# Patient Record
Sex: Female | Born: 1985 | Race: Black or African American | Hispanic: No | Smoking: Current every day smoker
Health system: Southern US, Community
[De-identification: ages and names within clinical notes are randomized; demographics above are authoritative.]

## PROBLEM LIST (undated history)

## (undated) DIAGNOSIS — E079 Disorder of thyroid, unspecified: Secondary | ICD-10-CM

## (undated) DIAGNOSIS — F329 Major depressive disorder, single episode, unspecified: Secondary | ICD-10-CM

## (undated) DIAGNOSIS — J45909 Unspecified asthma, uncomplicated: Secondary | ICD-10-CM

## (undated) DIAGNOSIS — F32A Depression, unspecified: Secondary | ICD-10-CM

## (undated) DIAGNOSIS — G43909 Migraine, unspecified, not intractable, without status migrainosus: Secondary | ICD-10-CM

## (undated) DIAGNOSIS — I10 Essential (primary) hypertension: Secondary | ICD-10-CM

---

## 2009-08-04 ENCOUNTER — Emergency Department (HOSPITAL_COMMUNITY): Admission: EM | Admit: 2009-08-04 | Discharge: 2009-08-04 | Payer: Self-pay | Admitting: Emergency Medicine

## 2009-08-26 ENCOUNTER — Emergency Department (HOSPITAL_COMMUNITY): Admission: EM | Admit: 2009-08-26 | Discharge: 2009-08-27 | Payer: Self-pay | Admitting: Emergency Medicine

## 2010-01-01 ENCOUNTER — Emergency Department (HOSPITAL_COMMUNITY): Admission: EM | Admit: 2010-01-01 | Discharge: 2010-01-01 | Payer: Self-pay | Admitting: Emergency Medicine

## 2010-07-03 ENCOUNTER — Emergency Department (HOSPITAL_BASED_OUTPATIENT_CLINIC_OR_DEPARTMENT_OTHER)
Admission: EM | Admit: 2010-07-03 | Discharge: 2010-07-03 | Payer: Self-pay | Source: Home / Self Care | Admitting: Emergency Medicine

## 2010-08-09 ENCOUNTER — Ambulatory Visit
Admission: RE | Admit: 2010-08-09 | Discharge: 2010-08-09 | Payer: Self-pay | Source: Home / Self Care | Attending: Obstetrics & Gynecology | Admitting: Obstetrics & Gynecology

## 2010-08-09 ENCOUNTER — Other Ambulatory Visit
Admission: RE | Admit: 2010-08-09 | Discharge: 2010-08-09 | Payer: Self-pay | Source: Home / Self Care | Admitting: Obstetrics and Gynecology

## 2010-08-13 LAB — POCT PREGNANCY, URINE: Preg Test, Ur: NEGATIVE

## 2010-08-16 ENCOUNTER — Ambulatory Visit
Admission: RE | Admit: 2010-08-16 | Discharge: 2010-08-16 | Payer: Self-pay | Source: Home / Self Care | Attending: Obstetrics and Gynecology | Admitting: Obstetrics and Gynecology

## 2010-08-17 NOTE — Progress Notes (Unsigned)
NAME:  Teresa Snyder, MOLNAR NO.:  [id number]  MEDICAL RECORD NO.:  [id number]          PATIENT TYPE:  WOC  LOCATION:  [facility]                   FACILITY:  [facility]  PHYSICIAN:  Argentina Donovan, MD        DATE OF BIRTH:  [birth year]  DATE OF SERVICE:  08/16/2010                                 CLINIC NOTE  HISTORY:  The patient is a 25 year old morbidly obese, gravida 2, para 2- 0-0-2, weighing 410 pounds and being 5 feet 7 inches tall, who is being treated for dysfunctional uterine bleeding.  She had an endometrial biopsy which was normal.  She is scheduled for an ultrasound but she came in today because of bleeding had not stopped since she started on the Provera and went off the Megace.  She is recently on the last few days placed on thyroid by her primary after she has had lab tests done at work that showed she was hypothyroid.  They started her on 2.5 mg of Synthroid.  I have told her to stop the Provera today, the bleeding will probably increase and then hopefully stop.  If it does not stop over the next 5-6 days, they are look like it is stopping, to go ahead and start the Megace for 10 days, then stop it.  That will run into beginning of February and from that time, on March 1 to every month afterwards to take Provera for the first 10 days of the month.  The alternative if it does not stop it, just to start the Megace for 10 days which will run her into the beginning and then began of March 1 with the Provera.  I am hesitant to start on birth control pills because of her weight in the fact that she has a history of mother with breast cancer at the age of 30.  The patient also had been on the Mirena IUD, but stopped and had it removed because of the effect that had on her weight.  IMPRESSION:  Dysfunctional uterine bleeding secondary to polycystic ovarian syndrome and morbid obesity.          ______________________________ Argentina Donovan, MD    PR/MEDQ  D:   08/16/2010  T:  08/17/2010  Job:  [id number]

## 2010-08-23 ENCOUNTER — Ambulatory Visit: Admit: 2010-08-23 | Payer: Self-pay | Admitting: Obstetrics & Gynecology

## 2010-08-27 ENCOUNTER — Ambulatory Visit (HOSPITAL_COMMUNITY)
Admission: RE | Admit: 2010-08-27 | Discharge: 2010-08-27 | Payer: Self-pay | Source: Home / Self Care | Attending: Family Medicine | Admitting: Family Medicine

## 2010-08-27 ENCOUNTER — Encounter
Admission: RE | Admit: 2010-08-27 | Discharge: 2010-08-27 | Payer: Self-pay | Source: Home / Self Care | Attending: Obstetrics & Gynecology | Admitting: Obstetrics & Gynecology

## 2010-08-29 ENCOUNTER — Ambulatory Visit: Admit: 2010-08-29 | Payer: Self-pay | Admitting: Obstetrics & Gynecology

## 2010-08-29 ENCOUNTER — Ambulatory Visit: Payer: Self-pay | Admitting: Obstetrics & Gynecology

## 2010-08-29 DIAGNOSIS — N949 Unspecified condition associated with female genital organs and menstrual cycle: Secondary | ICD-10-CM

## 2010-09-29 ENCOUNTER — Emergency Department (HOSPITAL_BASED_OUTPATIENT_CLINIC_OR_DEPARTMENT_OTHER)
Admission: EM | Admit: 2010-09-29 | Discharge: 2010-09-30 | Disposition: A | Discharge status: Home / Self Care | Payer: BC Managed Care – PPO | Attending: Emergency Medicine | Admitting: Emergency Medicine

## 2010-09-29 DIAGNOSIS — R509 Fever, unspecified: Secondary | ICD-10-CM | POA: Insufficient documentation

## 2010-09-29 DIAGNOSIS — L738 Other specified follicular disorders: Secondary | ICD-10-CM | POA: Insufficient documentation

## 2010-09-29 DIAGNOSIS — E039 Hypothyroidism, unspecified: Secondary | ICD-10-CM | POA: Insufficient documentation

## 2010-09-29 DIAGNOSIS — I10 Essential (primary) hypertension: Secondary | ICD-10-CM | POA: Insufficient documentation

## 2010-09-29 DIAGNOSIS — J45909 Unspecified asthma, uncomplicated: Secondary | ICD-10-CM | POA: Insufficient documentation

## 2010-09-29 DIAGNOSIS — E669 Obesity, unspecified: Secondary | ICD-10-CM | POA: Insufficient documentation

## 2010-09-29 DIAGNOSIS — Z79899 Other long term (current) drug therapy: Secondary | ICD-10-CM | POA: Insufficient documentation

## 2010-10-09 LAB — URINALYSIS, ROUTINE W REFLEX MICROSCOPIC
Bilirubin Urine: NEGATIVE
Glucose, UA: NEGATIVE mg/dL
Hgb urine dipstick: NEGATIVE
Ketones, ur: NEGATIVE mg/dL
Nitrite: NEGATIVE
Protein, ur: NEGATIVE mg/dL
Specific Gravity, Urine: 1.039 — ABNORMAL HIGH (ref 1.005–1.030)
Urobilinogen, UA: 0.2 mg/dL (ref 0.0–1.0)
pH: 5.5 (ref 5.0–8.0)

## 2010-10-09 LAB — CBC
HCT: 35.9 % — ABNORMAL LOW (ref 36.0–46.0)
Hemoglobin: 12.8 g/dL (ref 12.0–15.0)
MCH: 29.6 pg (ref 26.0–34.0)
MCHC: 35.6 g/dL (ref 30.0–36.0)
MCV: 83.1 fL (ref 78.0–100.0)
Platelets: 229 10*3/uL (ref 150–400)
RBC: 4.33 MIL/uL (ref 3.87–5.11)
RDW: 12.8 % (ref 11.5–15.5)
WBC: 8.9 10*3/uL (ref 4.0–10.5)

## 2010-10-09 LAB — PREGNANCY, URINE: Preg Test, Ur: NEGATIVE

## 2011-06-28 ENCOUNTER — Telehealth: Payer: Self-pay | Admitting: *Deleted

## 2011-06-28 MED ORDER — MEDROXYPROGESTERONE ACETATE 10 MG PO TABS: ORAL_TABLET | ORAL | Status: DC

## 2011-06-28 MED ORDER — MEGESTROL ACETATE 40 MG PO TABS: ORAL_TABLET | ORAL | Status: DC

## 2011-06-28 NOTE — Telephone Encounter (Signed)
Pt left message requesting refill of Provera and Megace.

## 2011-06-28 NOTE — Telephone Encounter (Signed)
I consulted with Dr. Shawnie Pons and RX was approved.  I contacted pt and informed her. She told me that she does not have a period unless she takes the Provera and because the bleeding is so heavy, she does not take it every month. I told pt that it is ok for now if she has a period every 3 mos. I also stated that she will need appt within the next couple months for annual exam and follow up. Pt voiced understanding.

## 2011-07-31 ENCOUNTER — Ambulatory Visit: Payer: BC Managed Care – PPO | Admitting: Family

## 2012-01-30 IMAGING — CR DG CHEST 2V
2 series · 2 of 2 positions shown · non-contrast
Comparison: None

CLINICAL DATA: Shortness of breath and coughing.

CHEST - 2 VIEW

[w chest pa]
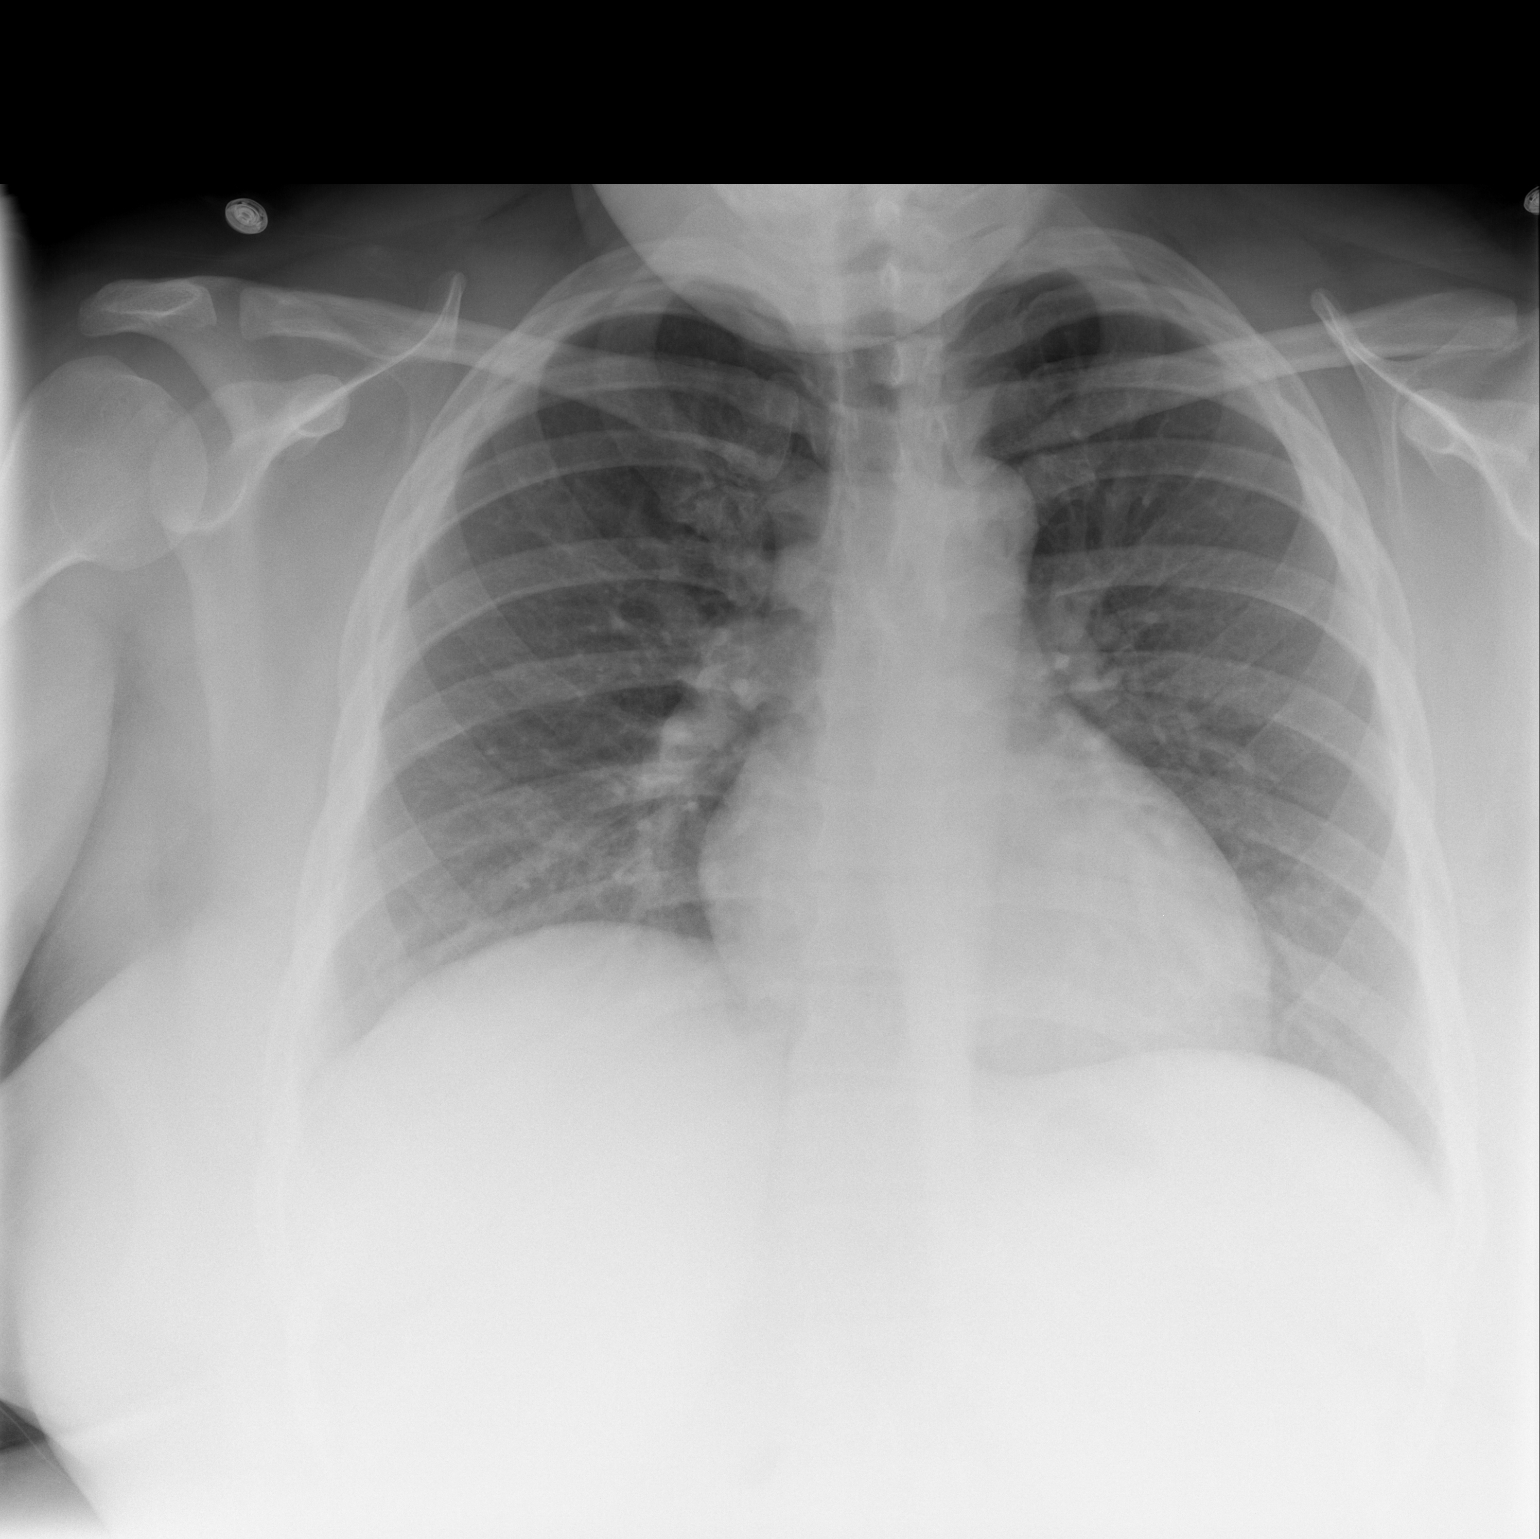

[w chest lat]
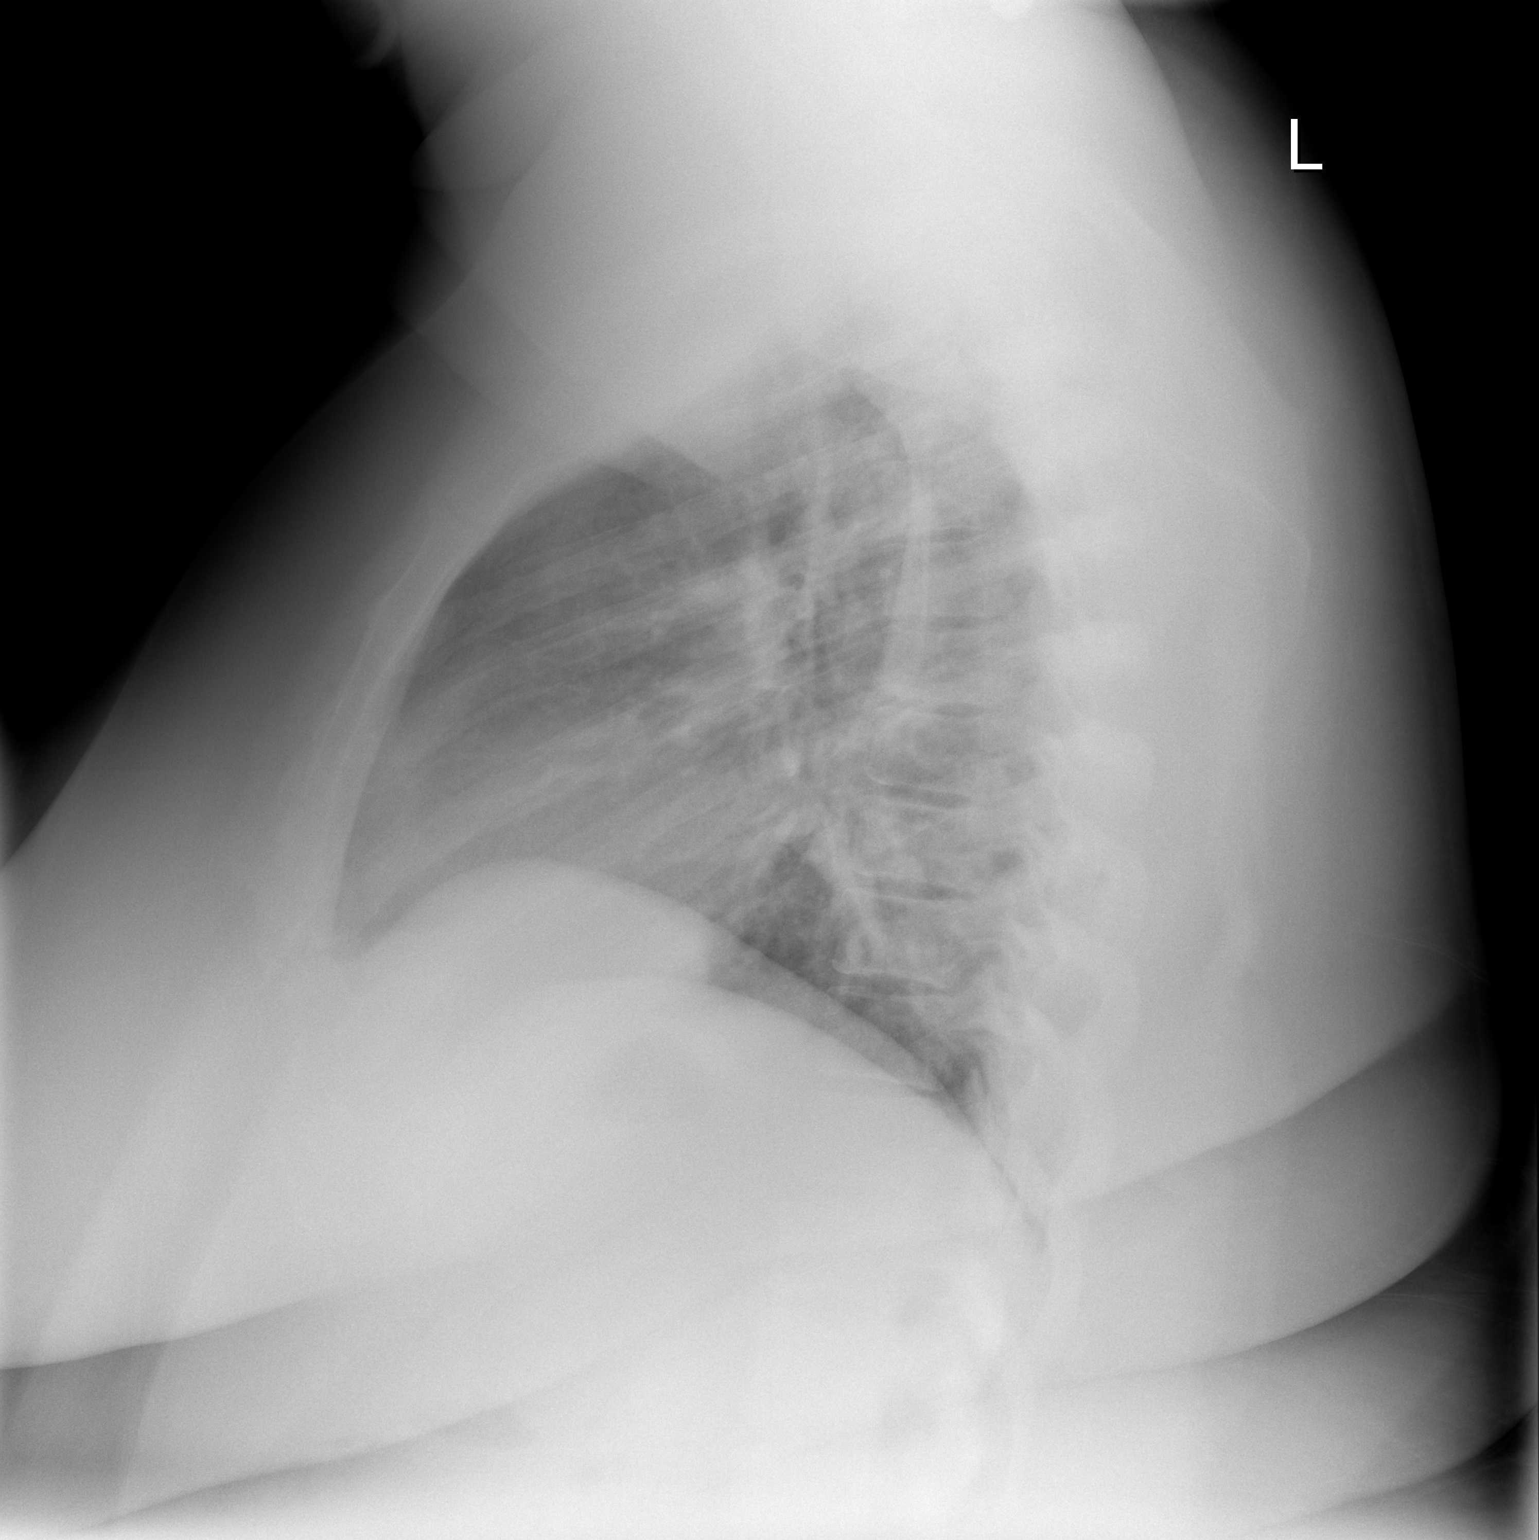

[2 of 2 positions shown; findings below may reference images not displayed]

FINDINGS: Two views of the chest were obtained.  The lungs are
essentially clear without focal airspace disease.  Slight
prominence of the central vascular structures.  No evidence for
pleural effusions.  The cardiac silhouette is within normal limits.
Trachea is midline.  The bony structures are intact.
IMPRESSION: No acute chest findings.

## 2012-02-08 ENCOUNTER — Encounter (HOSPITAL_COMMUNITY): Payer: Self-pay | Admitting: *Deleted

## 2012-02-08 ENCOUNTER — Emergency Department (INDEPENDENT_AMBULATORY_CARE_PROVIDER_SITE_OTHER)
Admission: EM | Admit: 2012-02-08 | Discharge: 2012-02-08 | Disposition: A | Discharge status: Home / Self Care | Payer: BC Managed Care – PPO | Source: Home / Self Care | Attending: Emergency Medicine | Admitting: Emergency Medicine

## 2012-02-08 DIAGNOSIS — L500 Allergic urticaria: Secondary | ICD-10-CM

## 2012-02-08 DIAGNOSIS — IMO0001 Reserved for inherently not codable concepts without codable children: Secondary | ICD-10-CM

## 2012-02-08 HISTORY — DX: Depression, unspecified: F32.A

## 2012-02-08 HISTORY — DX: Major depressive disorder, single episode, unspecified: F32.9

## 2012-02-08 HISTORY — DX: Essential (primary) hypertension: I10

## 2012-02-08 HISTORY — DX: Disorder of thyroid, unspecified: E07.9

## 2012-02-08 MED ORDER — METHYLPREDNISOLONE SODIUM SUCC 125 MG IJ SOLR
125.0000 mg | Freq: Once | INTRAMUSCULAR | Status: AC
  Administered 2012-02-08: 125 mg via INTRAMUSCULAR

## 2012-02-08 MED ORDER — METHYLPREDNISOLONE SODIUM SUCC 125 MG IJ SOLR
INTRAMUSCULAR | Status: AC
  Filled 2012-02-08: qty 2

## 2012-02-08 NOTE — ED Notes (Signed)
Co rash to arms, hands and thighs, starting 1 month ago, improved some with benadryl and eucerin, has recurred for past 2-3 days, not improving, raised rash with itch.

## 2012-02-08 NOTE — ED Provider Notes (Signed)
Medical screening examination/treatment/procedure(s) were performed by non-physician practitioner and as supervising physician I was immediately available for consultation/collaboration.  Arther Heisler   Esteban Kobashigawa, MD 02/08/12 2023 

## 2012-02-08 NOTE — ED Provider Notes (Signed)
History     CSN: [id number]  Arrival date & time 02/08/12  Teresa Snyder   First MD Initiated Contact with Patient 02/08/12 1844      Chief Complaint  Patient presents with  . Rash    (Consider location/radiation/quality/duration/timing/severity/associated sxs/prior treatment) HPI Comments: Pt states that the rash has intermittently been there for a month:pt states that the last 2-3 days have been worse:pt states that she has no known new exposure:pt states that the benadryl helps but it is short term  Patient is a 26 y.o. female presenting with rash. The history is provided by the patient. No language interpreter was used.  Rash  This is a new problem. The current episode started more than 1 week ago. The problem has not changed since onset.The problem is associated with an unknown factor. There has been no fever. The patient is experiencing no pain. Associated symptoms include itching. She has tried antihistamines for the symptoms.    Past Medical History  Diagnosis Date  . Thyroid disease   . Hypertension   . Depression     History reviewed. No pertinent past surgical history.  History reviewed. No pertinent family history.  History  Substance Use Topics  . Smoking status: Never Smoker   . Smokeless tobacco: Not on file  . Alcohol Use: Yes    OB History    Grav Para Term Preterm Abortions TAB SAB Ect Mult Living                  Review of Systems  Constitutional: Negative.   HENT: Negative.   Eyes: Negative.   Respiratory: Negative for shortness of breath and wheezing.   Cardiovascular: Negative.   Skin: Positive for itching and rash.    Allergies  Review of patient's allergies indicates no known allergies.  Home Medications   Current Outpatient Rx  Name Route Sig Dispense Refill  . CITALOPRAM HYDROBROMIDE 20 MG PO TABS Oral Take 20 mg by mouth daily.    Marland Kitchen HYDROCHLOROTHIAZIDE 25 MG PO TABS Oral Take 25 mg by mouth daily.    Marland Kitchen LEVOTHYROXINE SODIUM 25 MCG PO  TABS Oral Take 25 mcg by mouth daily.    Marland Kitchen MEDROXYPROGESTERONE ACETATE 10 MG PO TABS  Take 1 tablet daily for 10 days. 10 tablet 2  . MEGESTROL ACETATE 40 MG PO TABS  Take 1 tablet by mouth daily x10 days for heavy bleeding 10 tablet 2    BP 138/74  Pulse 86  Temp 98.3 F (36.8 C) (Oral)  Resp 20  SpO2 100%  Physical Exam  Nursing note and vitals reviewed. Constitutional: She is oriented to person, place, and time. She appears well-developed and well-nourished.  HENT:  Head: Normocephalic.  Right Ear: External ear normal.  Left Ear: External ear normal.  Mouth/Throat: Oropharynx is clear and moist.  Cardiovascular: Normal rate and regular rhythm.   Pulmonary/Chest: Effort normal and breath sounds normal.  Neurological: She is alert and oriented to person, place, and time.  Skin:       Pt has hives noted to leg and arms:no facial involvement noted    ED Course  Procedures (including critical care time)  Labs Reviewed - No data to display No results found.   1. Allergic reaction, urticaria       MDM  Pt given steroid here and instructed on continued benadryl at home:no known new exposure:pt has diffuse hives        Teresa Lower, NP 02/08/12 1959

## 2012-02-16 ENCOUNTER — Emergency Department (HOSPITAL_COMMUNITY)
Admission: EM | Admit: 2012-02-16 | Discharge: 2012-02-17 | Disposition: A | Discharge status: Home / Self Care | Payer: Self-pay | Attending: Emergency Medicine | Admitting: Emergency Medicine

## 2012-02-16 ENCOUNTER — Encounter (HOSPITAL_COMMUNITY): Payer: Self-pay | Admitting: Family Medicine

## 2012-02-16 DIAGNOSIS — E079 Disorder of thyroid, unspecified: Secondary | ICD-10-CM | POA: Insufficient documentation

## 2012-02-16 DIAGNOSIS — I10 Essential (primary) hypertension: Secondary | ICD-10-CM | POA: Insufficient documentation

## 2012-02-16 DIAGNOSIS — L509 Urticaria, unspecified: Secondary | ICD-10-CM | POA: Insufficient documentation

## 2012-02-16 MED ORDER — PREDNISONE 10 MG PO TABS: 40.0000 mg | ORAL_TABLET | Freq: Every day | ORAL | Status: DC

## 2012-02-16 MED ORDER — FAMOTIDINE 20 MG PO TABS: 20.0000 mg | ORAL_TABLET | Freq: Two times a day (BID) | ORAL | Status: DC

## 2012-02-16 MED ORDER — PREDNISONE 20 MG PO TABS: 60.0000 mg | ORAL_TABLET | Freq: Once | ORAL | Status: DC

## 2012-02-16 MED ORDER — DIPHENHYDRAMINE HCL 25 MG PO TABS: 25.0000 mg | ORAL_TABLET | Freq: Four times a day (QID) | ORAL | Status: DC

## 2012-02-16 NOTE — ED Notes (Signed)
Pt complaining of rash that has been ongoing for over 1 week. Pt was treated at [facility] and improved but now the rash is reaccuring. Has been taking benadryl and Pepcid without relief.

## 2012-02-16 NOTE — ED Provider Notes (Signed)
History  Scribed for Teresa Jakes, MD, the patient was seen in room TR06C/TR06C. This chart was scribed by Candelaria Stagers. The patient's care started at 3:58 PM   CSN: [id number]  Arrival date & time 02/16/12  1422   None     Chief Complaint  Patient presents with  . Allergic Reaction     The history is provided by the patient.   Teresa Snyder is a 26 y.o. female who presents to the Emergency Department complaining of reccurent hives on her legs and abdomen that became worse yesterday.  She is also experiencing swelling of the upper lip.  Pt was seen in Urgent care one week ago for the same sx and was given a steroid shot.  Pt has taken Pepcid with relief.   She reports that she has had this same type of reaction about one month ago that was relieved with benadryl.  She is experiencing no other sx.    Past Medical History  Diagnosis Date  . Thyroid disease   . Hypertension   . Depression     History reviewed. No pertinent past surgical history.  History reviewed. No pertinent family history.  History  Substance Use Topics  . Smoking status: Never Smoker   . Smokeless tobacco: Not on file  . Alcohol Use: Yes    OB History    Grav Para Term Preterm Abortions TAB SAB Ect Mult Living                  Review of Systems  Constitutional: Negative for fever.  HENT: Negative for congestion and sore throat.   Respiratory: Negative for cough and shortness of breath.   Cardiovascular: Negative for chest pain.  Gastrointestinal: Positive for nausea. Negative for vomiting, abdominal pain and diarrhea.  Genitourinary: Negative for dysuria.  Musculoskeletal: Negative for back pain.  Skin: Positive for rash (hives on legs and abdomen).  All other systems reviewed and are negative.    Allergies  Review of patient's allergies indicates no known allergies.  Home Medications   Current Outpatient Rx  Name Route Sig Dispense Refill  . ALBUTEROL SULFATE HFA 108 (90  BASE) MCG/ACT IN AERS Inhalation Inhale 2 puffs into the lungs every 6 (six) hours as needed. For shortness of breath or wheezing    . CITALOPRAM HYDROBROMIDE 20 MG PO TABS Oral Take 20 mg by mouth daily.    Marland Kitchen DIPHENHYDRAMINE HCL 25 MG PO TABS Oral Take 25 mg by mouth every 6 (six) hours as needed. For allergic reaction    . FAMOTIDINE 10 MG PO CHEW Oral Chew 10 mg by mouth 2 (two) times daily as needed. For allergic reaction    . HYDROCHLOROTHIAZIDE 25 MG PO TABS Oral Take 25 mg by mouth daily.    Marland Kitchen LEVOTHYROXINE SODIUM 25 MCG PO TABS Oral Take 25 mcg by mouth daily.    Marland Kitchen MEDROXYPROGESTERONE ACETATE 10 MG PO TABS Oral Take 10 mg by mouth See admin instructions. 1 tablet every day for 10 days once every 3 months    . MEGESTROL ACETATE 40 MG PO TABS Oral Take 40 mg by mouth daily as needed. For heavy bleeding or if menses lasts longer than 10 days    . NAPROXEN SODIUM 220 MG PO TABS Oral Take 440 mg by mouth 2 (two) times daily as needed. For headache    . DIPHENHYDRAMINE HCL 25 MG PO TABS Oral Take 1 tablet (25 mg total) by mouth every 6 (six) hours.  20 tablet 0  . FAMOTIDINE 20 MG PO TABS Oral Take 1 tablet (20 mg total) by mouth 2 (two) times daily. 14 tablet 0  . PREDNISONE 10 MG PO TABS Oral Take 4 tablets (40 mg total) by mouth daily. 20 tablet 0    BP 151/89  Pulse 89  Temp 98 F (36.7 C)  Resp 14  SpO2 99%  LMP 09/19/2011  Physical Exam  Nursing note and vitals reviewed. Constitutional: She is oriented to person, place, and time. She appears well-developed and well-nourished. No distress.  HENT:  Head: Normocephalic and atraumatic.       Swelling on the left side of the upper lip.  No swelling of the tongue.    Eyes: EOM are normal.  Cardiovascular: Normal rate and regular rhythm.   No murmur heard. Pulmonary/Chest: She has no wheezes. She has no rales.  Abdominal: Bowel sounds are normal. There is no tenderness.  Lymphadenopathy:    She has no cervical adenopathy.    Neurological: She is alert and oriented to person, place, and time. No cranial nerve deficit.  Skin: She is not diaphoretic.       Hives with raised whelps on the abdomen and thighs bilaterally.   Psychiatric: She has a normal mood and affect. Her behavior is normal.    ED Course  Procedures  DIAGNOSTIC STUDIES: Oxygen Saturation is 99% on room air, normal by my interpretation.    COORDINATION OF CARE:    Labs Reviewed - No data to display No results found.   1. Urticaria       MDM  Scattered hives but by hx on going on off for several weeks needs follow up with pcm for further evaluation. No tongue swelling now or in past.   I personally performed the services described in this documentation, which was scribed in my presence. The recorded information has been reviewed and considered.        Teresa Jakes, MD 02/16/12 [id number]

## 2012-02-17 NOTE — ED Notes (Signed)
See downtime charting. 

## 2012-07-20 ENCOUNTER — Telehealth: Payer: Self-pay | Admitting: *Deleted

## 2012-07-20 NOTE — Telephone Encounter (Signed)
Pt left message stating that her pharmacy [facility]) has sent refill request to Dr. Shawnie Pons for Megace and has not had a response. She is wondering if this medication is going to be authorized.  I called pt and left message that she will need to be seen and evaluated if she is having a problem. This refill cannot be given without an evaluation. She should go to MAU if she needs immediate attention. Also, she missed her appt in Jan of this year for routine Gyn exam. Please call back to schedule that appt.

## 2012-07-25 ENCOUNTER — Inpatient Hospital Stay (HOSPITAL_COMMUNITY)
Admission: AD | Admit: 2012-07-25 | Discharge: 2012-07-25 | Disposition: A | Discharge status: Home / Self Care | Payer: 59 | Source: Ambulatory Visit | Attending: Family Medicine | Admitting: Family Medicine

## 2012-07-25 DIAGNOSIS — N898 Other specified noninflammatory disorders of vagina: Secondary | ICD-10-CM

## 2012-07-25 DIAGNOSIS — J069 Acute upper respiratory infection, unspecified: Secondary | ICD-10-CM | POA: Insufficient documentation

## 2012-07-25 DIAGNOSIS — N939 Abnormal uterine and vaginal bleeding, unspecified: Secondary | ICD-10-CM

## 2012-07-25 DIAGNOSIS — N938 Other specified abnormal uterine and vaginal bleeding: Secondary | ICD-10-CM | POA: Insufficient documentation

## 2012-07-25 DIAGNOSIS — N949 Unspecified condition associated with female genital organs and menstrual cycle: Secondary | ICD-10-CM | POA: Insufficient documentation

## 2012-07-25 LAB — WET PREP, GENITAL
Clue Cells Wet Prep HPF POC: NONE SEEN
Trich, Wet Prep: NONE SEEN
Yeast Wet Prep HPF POC: NONE SEEN

## 2012-07-25 LAB — CBC
HCT: 39.4 % (ref 36.0–46.0)
Hemoglobin: 12.9 g/dL (ref 12.0–15.0)
MCH: 28.2 pg (ref 26.0–34.0)
MCHC: 32.7 g/dL (ref 30.0–36.0)
MCV: 86.2 fL (ref 78.0–100.0)
Platelets: 270 10*3/uL (ref 150–400)
RBC: 4.57 MIL/uL (ref 3.87–5.11)
RDW: 13.6 % (ref 11.5–15.5)
WBC: 8.1 10*3/uL (ref 4.0–10.5)

## 2012-07-25 LAB — POCT PREGNANCY, URINE: Preg Test, Ur: NEGATIVE

## 2012-07-25 MED ORDER — MEGESTROL ACETATE 40 MG PO TABS: 40.0000 mg | ORAL_TABLET | Freq: Every day | ORAL | Status: DC | PRN

## 2012-07-25 NOTE — MAU Provider Note (Signed)
Chart reviewed and agree with management and plan.  

## 2012-07-25 NOTE — MAU Note (Addendum)
Patient reports periods of heavy and light bleeding throughout month cycle since February.  Was prescribed Provera and Megace in Jan 2012; stopped taking Provera in May.  Last time took Megace was 12/17. Active vaginal bleeding since 12/13.  Seen in clinic; no appts available and was told by clinic RN to come here.  Denies cramping. Passing some clots.   Patient also report productive cough x 2 days.

## 2012-07-25 NOTE — MAU Provider Note (Signed)
History     CSN: [id number]  Arrival date and time: 07/25/12 1307   First Provider Initiated Contact with Patient 07/25/12 1433      Chief Complaint  Patient presents with  . Vaginal Bleeding   HPIAntonia Emelda Snyder is 26 y.o. No obstetric history on file. Unknown weeks presenting with vaginal bleeding.  Patient of Dr. Shawnie Pons.  Has been seen for vaginal bleeding in the past treated with Megace.  Spoke to clinic on 12/23 and was instructed to come to [facility]U.  Has been out of town and now presents for treatment because it was "so long" until she could get an appointment.  LMP 12/13  And continues.  She took Megace 2 tabs on 12/17.  Bleeding slowed and then restarted the 18th until now.  Passing small clots.  Crampy when she passes a big clot.  Sexually active. Not using contraceptive because pregnancy would be ok.  Has had headaches, cough, and neck stiffness.  Denies fever.     Past Medical History  Diagnosis Date  . Thyroid disease   . Hypertension   . Depression     No past surgical history on file.  No family history on file.  History  Substance Use Topics  . Smoking status: Never Smoker   . Smokeless tobacco: Not on file  . Alcohol Use: Yes    Allergies: No Known Allergies  Prescriptions prior to admission  Medication Sig Dispense Refill  . chlorthalidone (HYGROTON) 25 MG tablet Take 25 mg by mouth daily.      Marland Kitchen levothyroxine (SYNTHROID, LEVOTHROID) 25 MCG tablet Take 25 mcg by mouth daily.      . naproxen sodium (ANAPROX) 220 MG tablet Take 440 mg by mouth 2 (two) times daily as needed. For headache      . Phenyleph-Doxylamine-DM-APAP (TYLENOL COLD MULTI-SYMPTOM) 5-6.25-10-325 MG/15ML LIQD Take 15 mLs by mouth daily as needed. Cold symptoms      . albuterol (PROVENTIL HFA;VENTOLIN HFA) 108 (90 BASE) MCG/ACT inhaler Inhale 2 puffs into the lungs every 6 (six) hours as needed. For shortness of breath or wheezing      . megestrol (MEGACE) 40 MG tablet Take 40 mg by mouth daily  as needed. For heavy bleeding or if menses lasts longer than 10 days        Review of Systems  HENT: Positive for congestion and neck pain.   Gastrointestinal: Positive for abdominal pain (crampy with passing of clots).  Genitourinary:       + for prolonged vaginal bleeding  Neurological: Positive for headaches.   Physical Exam   Blood pressure 144/75, pulse 75, temperature 98 F (36.7 C), temperature source Oral, resp. rate 18, height 5\' 8"  (1.727 m), weight 419 lb 9.6 oz (190.329 kg), last menstrual period 07/10/2012.  Physical Exam  Constitutional: She is oriented to person, place, and time. She appears well-developed and well-nourished. No distress.  Neck: Normal range of motion. Neck supple. Muscular tenderness (on left side) present. No rigidity.  Cardiovascular: Normal rate.   Respiratory: Effort normal.  GI: There is no tenderness. There is no rebound and no guarding.  Genitourinary: There is no tenderness or lesion on the right labia. There is no tenderness or lesion on the left labia. Uterus is not tender. Enlarged: difficult to assess due to body habitus. Right adnexum displays no mass, no tenderness and no fullness. Left adnexum displays no mass, no tenderness and no fullness. There is bleeding (small amount of dark red bleeding with a  few tiny clots) around the vagina.  Neurological: She is alert and oriented to person, place, and time.  Skin: Skin is warm and dry.  Psychiatric: She has a normal mood and affect. Her behavior is normal.   . Results for orders placed during the hospital encounter of 07/25/12 (from the past 24 hour(s))  POCT PREGNANCY, URINE     Status: Normal   Collection Time   07/25/12  1:52 PM      Component Value Range   Preg Test, Ur NEGATIVE  NEGATIVE  WET PREP, GENITAL     Status: Abnormal   Collection Time   07/25/12  2:43 PM      Component Value Range   Yeast Wet Prep HPF POC NONE SEEN  NONE SEEN   Trich, Wet Prep NONE SEEN  NONE SEEN   Clue  Cells Wet Prep HPF POC NONE SEEN  NONE SEEN   WBC, Wet Prep HPF POC FEW (*) NONE SEEN  CBC     Status: Normal   Collection Time   07/25/12  2:50 PM      Component Value Range   WBC 8.1  4.0 - 10.5 K/uL   RBC 4.57  3.87 - 5.11 MIL/uL   Hemoglobin 12.9  12.0 - 15.0 g/dL   HCT 16.1  09.6 - 04.5 %   MCV 86.2  78.0 - 100.0 fL   MCH 28.2  26.0 - 34.0 pg   MCHC 32.7  30.0 - 36.0 g/dL   RDW 40.9  81.1 - 91.4 %   Platelets 270  150 - 400 K/uL   [facility]U Course  Procedures  Gc/chl culture to lab  MDM 14:54  Reported MSE to Dr. Shawnie Pons.  Discussed refill for Megace and follow up in clinic.  Will request follow up appt.  Assessment and Plan  A:  History of abnormal vaginal bleeding      Ultrasound in past suggestive of adenomyosis       URI  P:  Rx for Megace     Follow up in the GYN CLINIC     May take tylenol, sudafed for URI sxs  Teresa Snyder,EVE M 07/25/2012, 2:36 PM

## 2012-07-25 NOTE — MAU Note (Signed)
Pt reports having increased vaginal bleeding. Has been on Megace to help. Rx has run out and bleeding is heavy again. Pt also c/o cough with some SOB with it. Has had cough for 3 days. No fever.

## 2012-07-26 LAB — GC/CHLAMYDIA PROBE AMP
CT Probe RNA: NEGATIVE
GC Probe RNA: NEGATIVE

## 2013-01-29 IMAGING — US US PELVIS COMPLETE MODIFY
1 series · 14 of 25 positions shown · non-contrast
Comparison: None.

CLINICAL DATA: Menorrhagia



[Series 1: us pelvis complete modify · 14 of 56 slices shown]
[im 1/56]
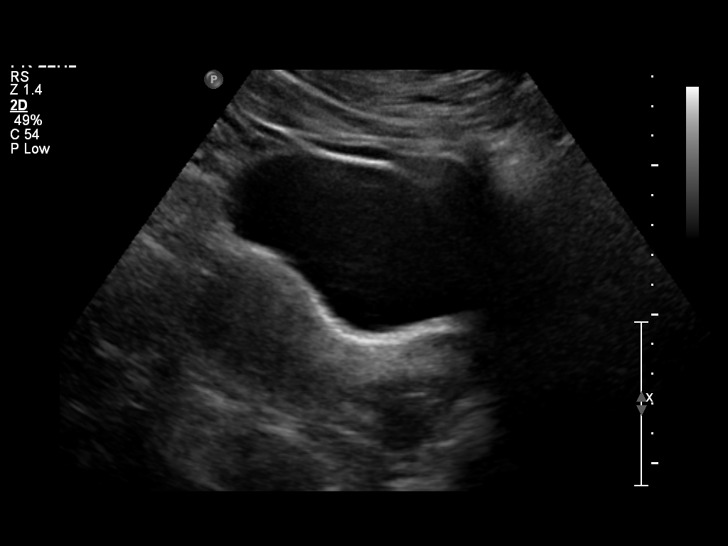
[im 5/56]
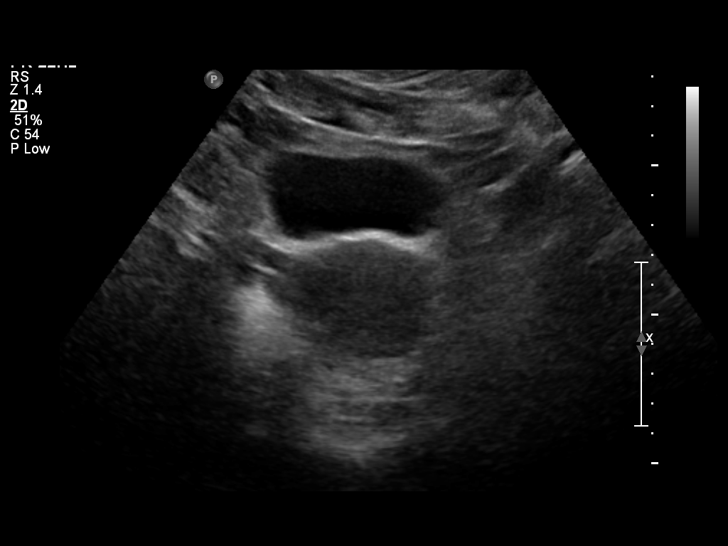
[im 10/56]
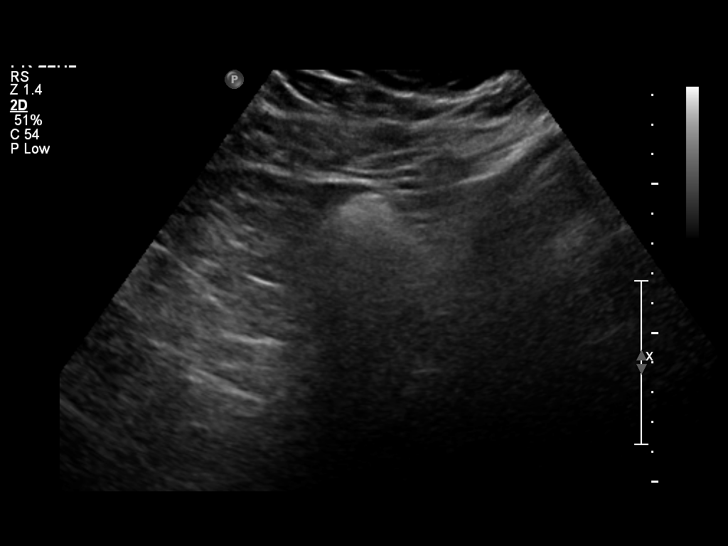
[im 14/56]
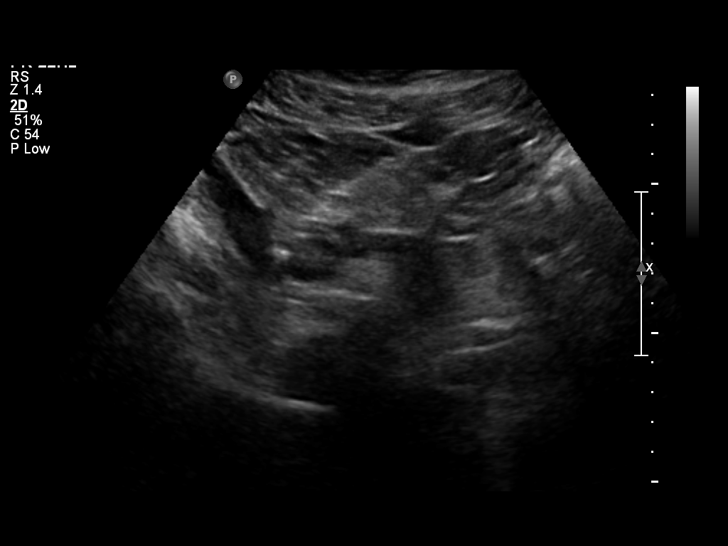
[im 19/56]
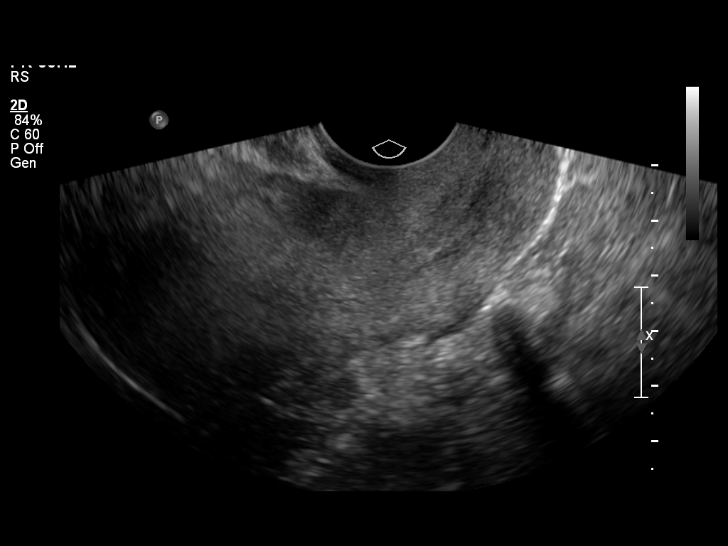
[im 21/56]
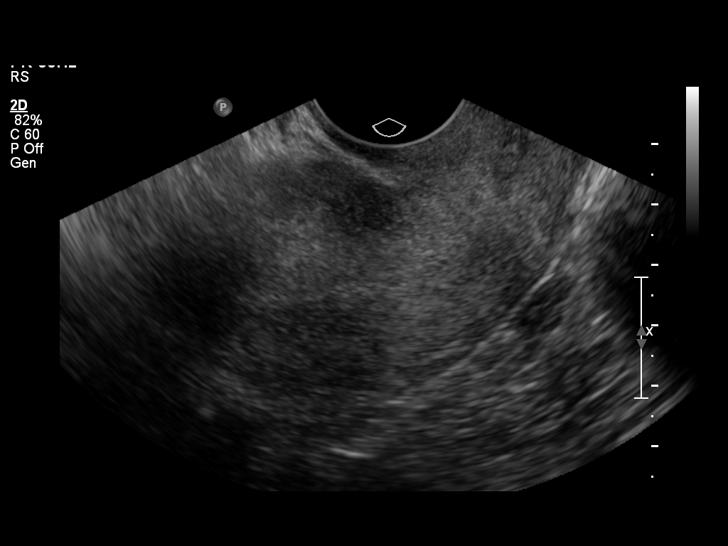
[im 26/56]
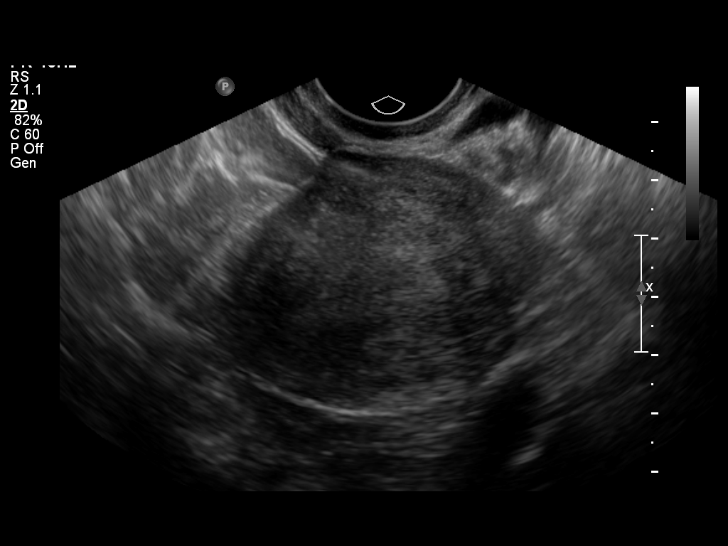
[im 30/56]
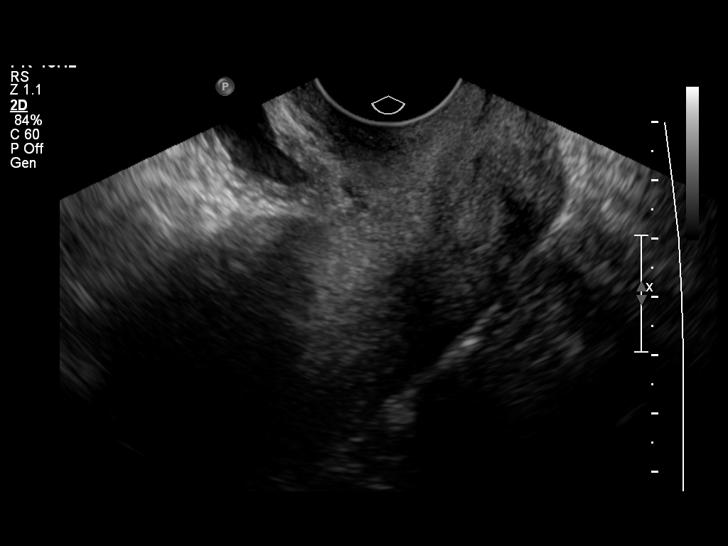
[im 35/56]
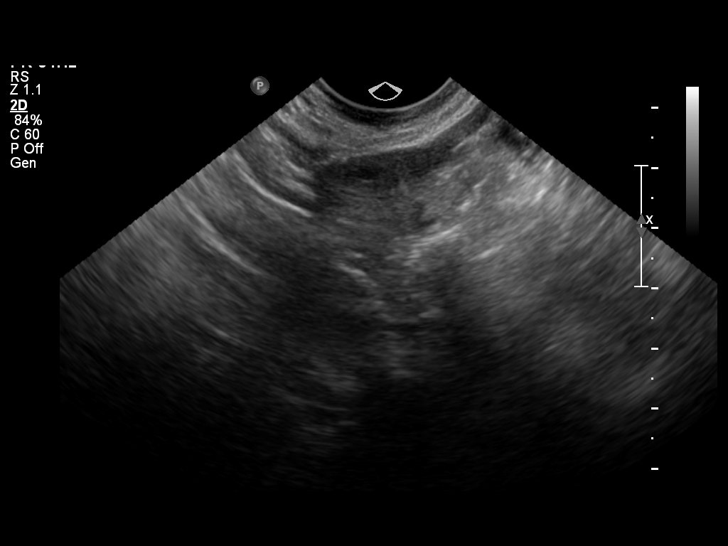
[im 37/56]
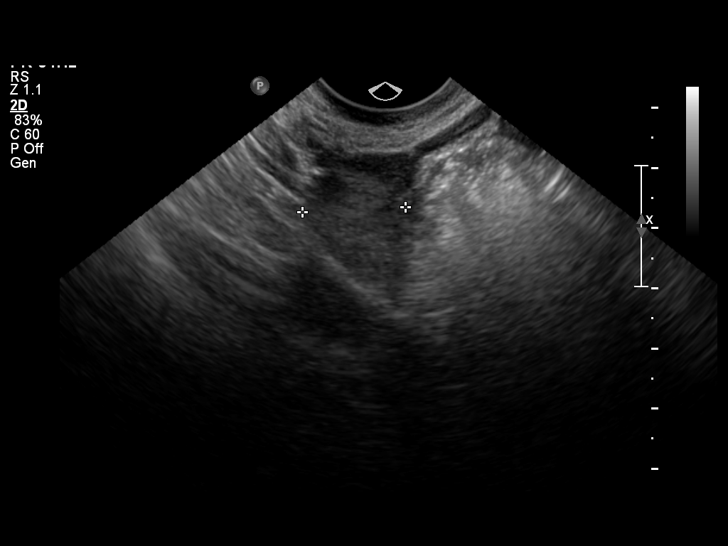
[im 42/56]
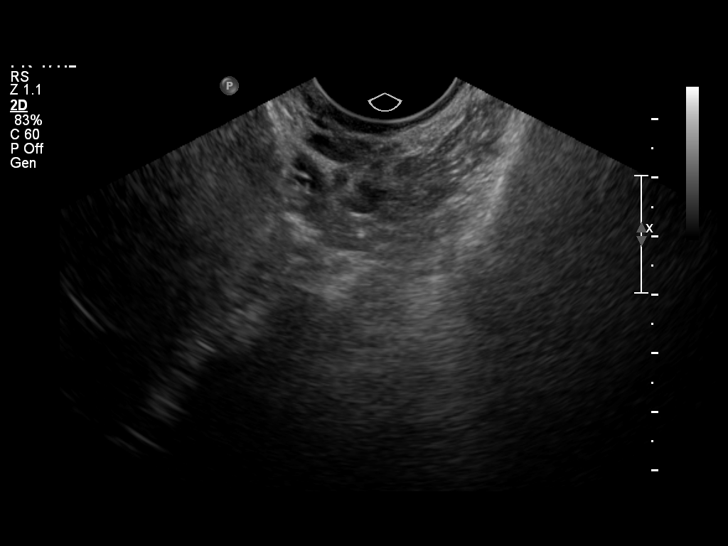
[im 46/56]
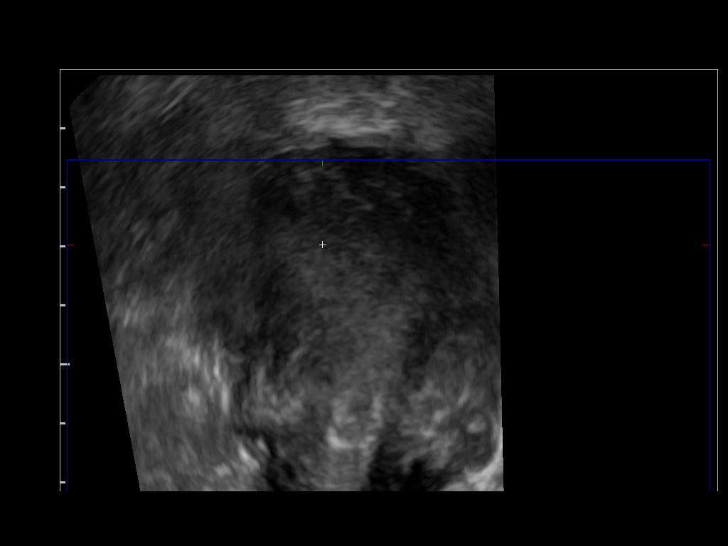
[im 51/56]
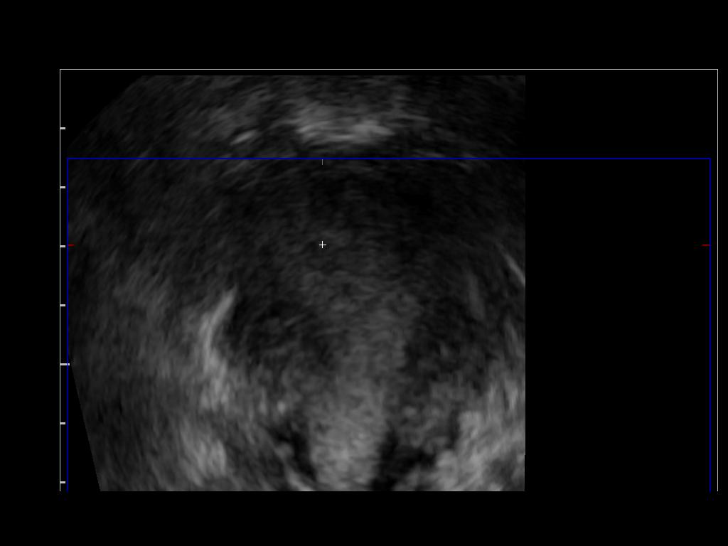
[im 56/56]
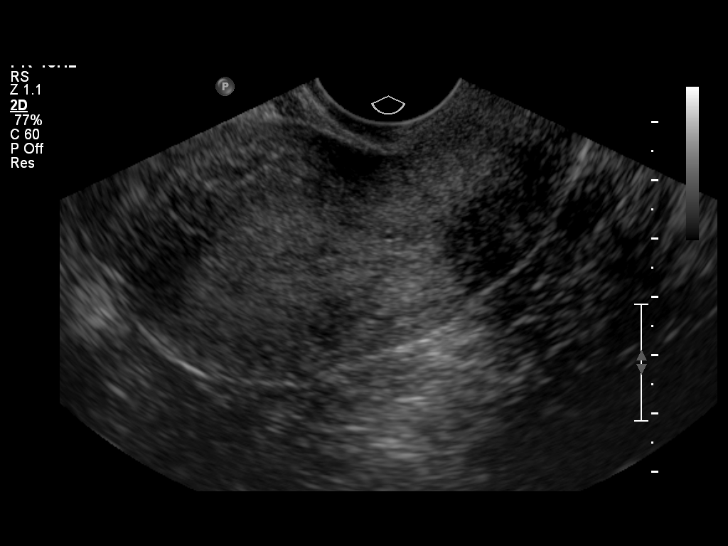

[14 of 25 positions shown; findings below may reference images not displayed]

FINDINGS: The uterine echotexture is heterogeneous which suggests
an element of adenomyosis.  No focal  fibroids are seen.
Endometrial stripe is within normal limits, measuring 3 mm in
width.  The overall uterine size is normal, measuring 8.9 x 4.4 x
5.5 cm.  The right ovary has normal size and appearance measuring
1.7 x 1.9 x 2.4 cm.  The left  ovary is not identified.  There are
no adnexal masses or free pelvic fluid.
IMPRESSION: Probable adenomyosis with heterogeneous myometrial echotexture.
Endometrial stripe is within normal limits.

## 2013-04-29 ENCOUNTER — Encounter (HOSPITAL_COMMUNITY): Payer: Self-pay | Admitting: Adult Health

## 2013-04-29 ENCOUNTER — Emergency Department (HOSPITAL_COMMUNITY)
Admission: EM | Admit: 2013-04-29 | Discharge: 2013-04-29 | Disposition: A | Discharge status: Home / Self Care | Payer: 59 | Attending: Emergency Medicine | Admitting: Emergency Medicine

## 2013-04-29 DIAGNOSIS — Z8659 Personal history of other mental and behavioral disorders: Secondary | ICD-10-CM | POA: Insufficient documentation

## 2013-04-29 DIAGNOSIS — Z79899 Other long term (current) drug therapy: Secondary | ICD-10-CM | POA: Insufficient documentation

## 2013-04-29 DIAGNOSIS — J329 Chronic sinusitis, unspecified: Secondary | ICD-10-CM | POA: Insufficient documentation

## 2013-04-29 DIAGNOSIS — I10 Essential (primary) hypertension: Secondary | ICD-10-CM | POA: Insufficient documentation

## 2013-04-29 DIAGNOSIS — E079 Disorder of thyroid, unspecified: Secondary | ICD-10-CM | POA: Insufficient documentation

## 2013-04-29 MED ORDER — AMOXICILLIN 500 MG PO CAPS: 500.0000 mg | ORAL_CAPSULE | Freq: Three times a day (TID) | ORAL | Status: DC

## 2013-04-29 MED ORDER — HYDROCODONE-ACETAMINOPHEN 5-325 MG PO TABS: 1.0000 | ORAL_TABLET | ORAL | Status: DC | PRN

## 2013-04-29 MED ORDER — FLUTICASONE PROPIONATE 50 MCG/ACT NA SUSP: NASAL | Status: DC

## 2013-04-29 NOTE — ED Notes (Signed)
Presents with sinus pressure, headache, runny nose and chest congestion for 2 days. Daughter is sick as well. Denies fever. Pt is in pediatrics with daughter at this time.

## 2013-04-29 NOTE — ED Notes (Signed)
Pt is still in pediatrics with her daughter

## 2013-04-29 NOTE — ED Notes (Signed)
Pt is in pediatrics with her daughter at this time.

## 2013-05-01 NOTE — ED Provider Notes (Signed)
CSN: [id number]     Arrival date & time 04/29/13  1629 History   First MD Initiated Contact with Patient 04/29/13 1910     Chief Complaint  Patient presents with  . Facial Pain    HPI  The patient is here with her daughter both have cold symptoms. Patient complains of 78 days of nasal congestion increasing facial pain states when she leans over feels pressure over her nose and cheeks bilaterally. Her top teeth are painful. No pain with chewing however. Minimal discharge. Bilateral ear pain. No fever. No cough or shortness of breath.  Past Medical History  Diagnosis Date  . Thyroid disease   . Hypertension   . Depression    History reviewed. No pertinent past surgical history. History reviewed. No pertinent family history. History  Substance Use Topics  . Smoking status: Never Smoker   . Smokeless tobacco: Not on file  . Alcohol Use: Yes   OB History   Grav Para Term Preterm Abortions TAB SAB Ect Mult Living                 Review of Systems  Constitutional: Negative for fever, chills, diaphoresis, appetite change and fatigue.  HENT: Positive for congestion, rhinorrhea, sneezing and sinus pressure. Negative for sore throat, mouth sores and trouble swallowing.   Eyes: Negative for visual disturbance.  Respiratory: Negative for cough, chest tightness, shortness of breath and wheezing.   Cardiovascular: Negative for chest pain.  Gastrointestinal: Negative for nausea, vomiting, abdominal pain, diarrhea and abdominal distention.  Endocrine: Negative for polydipsia, polyphagia and polyuria.  Genitourinary: Negative for dysuria, frequency and hematuria.  Musculoskeletal: Negative for gait problem.  Skin: Negative for color change, pallor and rash.  Neurological: Negative for dizziness, syncope, light-headedness and headaches.  Hematological: Does not bruise/bleed easily.  Psychiatric/Behavioral: Negative for behavioral problems and confusion.    Allergies  Review of patient's  allergies indicates no known allergies.  Home Medications   Current Outpatient Rx  Name  Route  Sig  Dispense  Refill  . albuterol (PROVENTIL HFA;VENTOLIN HFA) 108 (90 BASE) MCG/ACT inhaler   Inhalation   Inhale 2 puffs into the lungs every 6 (six) hours as needed. For shortness of breath or wheezing         . levothyroxine (SYNTHROID, LEVOTHROID) 25 MCG tablet   Oral   Take 25 mcg by mouth daily.         . megestrol (MEGACE) 40 MG tablet   Oral   Take 1 tablet (40 mg total) by mouth daily as needed. For heavy bleeding or if menses lasts longer than 10 days   30 tablet   0   . naproxen sodium (ANAPROX) 220 MG tablet   Oral   Take 440 mg by mouth 2 (two) times daily as needed. For headache         . Phenylephrine-Acetaminophen (SINUS CONGESTION/PAIN DAYTIME PO)   Oral   Take 2 tablets by mouth every 4 (four) hours as needed (for sinus congestion).         Marland Kitchen amoxicillin (AMOXIL) 500 MG capsule   Oral   Take 1 capsule (500 mg total) by mouth 3 (three) times daily.   42 capsule   0   . fluticasone (FLONASE) 50 MCG/ACT nasal spray      1 spray each nares bid   10 g   1   . HYDROcodone-acetaminophen (NORCO/VICODIN) 5-325 MG per tablet   Oral   Take 1 tablet by mouth  every 4 (four) hours as needed for pain.   10 tablet   0    BP 115/66  Pulse 87  Temp(Src) 98.6 F (37 C) (Oral)  Resp 16  SpO2 98% Physical Exam  Constitutional: She is oriented to person, place, and time. She appears well-developed and well-nourished. No distress.  HENT:  Head: Normocephalic.  Nasal congestion and tender to palpate over the bilateral maxilla  Eyes: Conjunctivae are normal. Pupils are equal, round, and reactive to light. No scleral icterus.  Neck: Normal range of motion. Neck supple. No thyromegaly present.  Cardiovascular: Normal rate and regular rhythm.  Exam reveals no gallop and no friction rub.   No murmur heard. Pulmonary/Chest: Effort normal and breath sounds normal.  No respiratory distress. She has no wheezes. She has no rales.  Abdominal: Soft. Bowel sounds are normal. She exhibits no distension. There is no tenderness. There is no rebound.  Musculoskeletal: Normal range of motion.  Neurological: She is alert and oriented to person, place, and time.  Skin: Skin is warm and dry. No rash noted.  Psychiatric: She has a normal mood and affect. Her behavior is normal.    ED Course  Procedures (including critical care time) Labs Review Labs Reviewed - No data to display Imaging Review No results found.  MDM   1. Sinusitis    Plan the plan will be empirically for sinusitis with amoxicillin and Flonase    Roney Marion, MD 05/01/13 [id number]

## 2013-11-10 ENCOUNTER — Ambulatory Visit (INDEPENDENT_AMBULATORY_CARE_PROVIDER_SITE_OTHER): Payer: 59 | Admitting: Emergency Medicine

## 2013-11-10 VITALS — BP 130/98 | HR 77 | Temp 98.6°F | Resp 16 | Ht 68.0 in | Wt >= 6400 oz

## 2013-11-10 DIAGNOSIS — J018 Other acute sinusitis: Secondary | ICD-10-CM

## 2013-11-10 MED ORDER — AMOXICILLIN-POT CLAVULANATE 875-125 MG PO TABS: 1.0000 | ORAL_TABLET | Freq: Two times a day (BID) | ORAL | Status: DC

## 2013-11-10 MED ORDER — PSEUDOEPHEDRINE-GUAIFENESIN ER 60-600 MG PO TB12: 1.0000 | ORAL_TABLET | Freq: Two times a day (BID) | ORAL | Status: AC

## 2013-11-10 NOTE — Progress Notes (Signed)
[facility] [address], [place] [phone]  Date:  11/10/2013   Name:  Teresa Snyder   DOB:  04/19/1986   MRN:  [id number]  PCP:  Gretel AcreNNODI, ADAKU, MD    Chief Complaint: Sore Throat   History of Present Illness:  Teresa Snyder is a 28 y.o. very pleasant female patient who presents with the following:  Ill with nasal congestion and post nasal drainage.  Has purulent nasal drip.  Sore throat.  Cough productive of purulent sputum  No wheezing or shortness of breath.  No nausea or vomiting.  No stool change or rash.  No improvement with over the counter medications or other home remedies. Denies other complaint or health concern today.   There are no active problems to display for this patient.   Past Medical History  Diagnosis Date  . Thyroid disease   . Hypertension   . Depression     History reviewed. No pertinent past surgical history.  History  Substance Use Topics  . Smoking status: Never Smoker   . Smokeless tobacco: Not on file  . Alcohol Use: Yes    History reviewed. No pertinent family history.  No Known Allergies  Medication list has been reviewed and updated.  Current Outpatient Prescriptions on File Prior to Visit  Medication Sig Dispense Refill  . levothyroxine (SYNTHROID, LEVOTHROID) 25 MCG tablet Take 25 mcg by mouth daily.      Marland Kitchen. albuterol (PROVENTIL HFA;VENTOLIN HFA) 108 (90 BASE) MCG/ACT inhaler Inhale 2 puffs into the lungs every 6 (six) hours as needed. For shortness of breath or wheezing      . amoxicillin (AMOXIL) 500 MG capsule Take 1 capsule (500 mg total) by mouth 3 (three) times daily.  42 capsule  0  . fluticasone (FLONASE) 50 MCG/ACT nasal spray 1 spray each nares bid  10 g  1  . HYDROcodone-acetaminophen (NORCO/VICODIN) 5-325 MG per tablet Take 1 tablet by mouth every 4 (four) hours as needed for pain.  10 tablet  0  . megestrol (MEGACE) 40 MG tablet Take 1 tablet (40 mg total) by mouth daily as  needed. For heavy bleeding or if menses lasts longer than 10 days  30 tablet  0  . naproxen sodium (ANAPROX) 220 MG tablet Take 440 mg by mouth 2 (two) times daily as needed. For headache      . Phenylephrine-Acetaminophen (SINUS CONGESTION/PAIN DAYTIME PO) Take 2 tablets by mouth every 4 (four) hours as needed (for sinus congestion).       No current facility-administered medications on file prior to visit.    Review of Systems:  As per HPI, otherwise negative.    Physical Examination: Filed Vitals:   11/10/13 1854  BP: 130/98  Pulse: 77  Temp: 98.6 F (37 C)  Resp: 16   Filed Vitals:   11/10/13 1854  Height: 5\' 8"  (1.727 m)  Weight: 419 lb 3.2 oz (190.148 kg)   Body mass index is 63.75 kg/(m^2). Ideal Body Weight: Weight in (lb) to have BMI = 25: 164.1  GEN: morbidly obese, NAD, Non-toxic, A & O x 3 HEENT: Atraumatic, Normocephalic. Neck supple. No masses, No LAD. Ears and Nose: No external deformity. CV: RRR, No M/G/R. No JVD. No thrill. No extra heart sounds. PULM: CTA B, no wheezes, crackles, rhonchi. No retractions. No resp. distress. No accessory muscle use. ABD: S, NT, ND, +BS. No rebound. No HSM. EXTR: No c/c/e NEURO Normal gait.  PSYCH: Normally interactive.  Conversant. Not depressed or anxious appearing.  Calm demeanor.    Assessment and Plan: Sinusitis augmentin mucinex d   Signed,  Phillips OdorJeffery Roschelle Calandra, MD

## 2013-11-10 NOTE — Patient Instructions (Signed)

## 2014-09-14 ENCOUNTER — Emergency Department (INDEPENDENT_AMBULATORY_CARE_PROVIDER_SITE_OTHER)
Admission: EM | Admit: 2014-09-14 | Discharge: 2014-09-14 | Disposition: A | Discharge status: Home / Self Care | Payer: Self-pay | Source: Home / Self Care | Attending: Family Medicine | Admitting: Family Medicine

## 2014-09-14 ENCOUNTER — Encounter (HOSPITAL_COMMUNITY): Payer: Self-pay | Admitting: Emergency Medicine

## 2014-09-14 DIAGNOSIS — G43019 Migraine without aura, intractable, without status migrainosus: Secondary | ICD-10-CM

## 2014-09-14 HISTORY — DX: Unspecified asthma, uncomplicated: J45.909

## 2014-09-14 HISTORY — DX: Migraine, unspecified, not intractable, without status migrainosus: G43.909

## 2014-09-14 MED ORDER — DEXAMETHASONE SODIUM PHOSPHATE 10 MG/ML IJ SOLN
10.0000 mg | Freq: Once | INTRAMUSCULAR | Status: AC
  Administered 2014-09-14: 10 mg via INTRAMUSCULAR

## 2014-09-14 MED ORDER — ACETAMINOPHEN 500 MG PO TABS
1000.0000 mg | ORAL_TABLET | Freq: Once | ORAL | Status: AC
  Administered 2014-09-14: 1000 mg via ORAL

## 2014-09-14 MED ORDER — DEXAMETHASONE SODIUM PHOSPHATE 10 MG/ML IJ SOLN
INTRAMUSCULAR | Status: AC
  Filled 2014-09-14: qty 1

## 2014-09-14 MED ORDER — SUMATRIPTAN SUCCINATE 6 MG/0.5ML ~~LOC~~ SOLN
SUBCUTANEOUS | Status: AC
  Filled 2014-09-14: qty 0.5

## 2014-09-14 MED ORDER — ACETAMINOPHEN 325 MG PO TABS
ORAL_TABLET | ORAL | Status: AC
  Filled 2014-09-14: qty 3

## 2014-09-14 MED ORDER — SUMATRIPTAN SUCCINATE 6 MG/0.5ML ~~LOC~~ SOLN
6.0000 mg | Freq: Once | SUBCUTANEOUS | Status: AC
  Administered 2014-09-14: 6 mg via SUBCUTANEOUS

## 2014-09-14 MED ORDER — ONDANSETRON HCL 4 MG PO TABS: 4.0000 mg | ORAL_TABLET | Freq: Three times a day (TID) | ORAL | Status: DC | PRN

## 2014-09-14 NOTE — ED Notes (Signed)
C/o intermittent HA that began 09/06/14 HA increases w/bright light Also reports she has missed over 60 hrs ++ of work due to HA Can't get in w/PCP until March 1st Alert, no signs of acute.

## 2014-09-14 NOTE — ED Provider Notes (Signed)
CSN: [id number]     Arrival date & time 09/14/14  4098 History   First MD Initiated Contact with Patient 09/14/14 [phone]     Chief Complaint  Patient presents with  . Headache   (Consider location/radiation/quality/duration/timing/severity/associated sxs/prior Treatment) HPI   HA: typically w/ 1-2 per year during season changes. Since 08/08/14 pt has had 5-6 HAs. Typically lasts 1-2 days but feels drained afterwords. Current HA started 8 days ago. Waxes and wanes. Maxalt, ibuprofen, and tylenol w/ benefit. Feels like typical HA but cannot chake this one. Typically in front of head. PHotophobia, phonophobia. Called PCP who told pt to come here for treatment and a work note as she cannot be seen there until 09/27/14. 1-2 caffinated beverages daily.    LMP: 2.6.16  Past Medical History  Diagnosis Date  . Thyroid disease   . Hypertension   . Depression   . Asthma   . Migraine    History reviewed. No pertinent past surgical history. Family History  Problem Relation Age of Onset  . Asthma Mother   . Thyroid disease Mother   . Hypertension Mother   . Migraines Mother   . Hypertension Father    History  Substance Use Topics  . Smoking status: Never Smoker   . Smokeless tobacco: Not on file  . Alcohol Use: Yes   OB History    No data available     Review of Systems Per HPI with all other pertinent systems negative.   Allergies  Review of patient's allergies indicates no known allergies.  Home Medications   Prior to Admission medications   Medication Sig Start Date End Date Taking? Authorizing Provider  levothyroxine (SYNTHROID, LEVOTHROID) 25 MCG tablet Take 25 mcg by mouth daily.   Yes Historical Provider, MD  rizatriptan (MAXALT) 5 MG tablet Take 5 mg by mouth as needed for migraine. May repeat in 2 hours if needed   Yes Historical Provider, MD  albuterol (PROVENTIL HFA;VENTOLIN HFA) 108 (90 BASE) MCG/ACT inhaler Inhale 2 puffs into the lungs every 6 (six) hours as needed.  For shortness of breath or wheezing    Historical Provider, MD  hydrochlorothiazide (HYDRODIURIL) 50 MG tablet Take 50 mg by mouth daily.    Historical Provider, MD  megestrol (MEGACE) 40 MG tablet Take 1 tablet (40 mg total) by mouth daily as needed. For heavy bleeding or if menses lasts longer than 10 days 07/25/12   Verita Schneiders Key, NP  naproxen sodium (ANAPROX) 220 MG tablet Take 440 mg by mouth 2 (two) times daily as needed. For headache    Historical Provider, MD  ondansetron (ZOFRAN) 4 MG tablet Take 1 tablet (4 mg total) by mouth every 8 (eight) hours as needed for nausea or vomiting. 09/14/14   Ozella Rocks, MD  Phenylephrine-Acetaminophen (SINUS CONGESTION/PAIN DAYTIME PO) Take 2 tablets by mouth every 4 (four) hours as needed (for sinus congestion).    Historical Provider, MD  pseudoephedrine-guaifenesin (MUCINEX D) 60-600 MG per tablet Take 1 tablet by mouth every 12 (twelve) hours. 11/10/13 11/10/14  Carmelina Dane, MD   BP 134/65 mmHg  Pulse 72  Temp(Src) 97.9 F (36.6 C) (Oral)  Resp 18  SpO2 100%  LMP 09/02/2014 Physical Exam  Constitutional: She is oriented to person, place, and time. She appears well-developed and well-nourished. No distress.  HENT:  Head: Normocephalic and atraumatic.  Eyes: EOM are normal. Pupils are equal, round, and reactive to light.  Neck: Normal range of motion.  Cardiovascular: Normal  rate, normal heart sounds and intact distal pulses.   Pulmonary/Chest: Effort normal and breath sounds normal.  Abdominal: Soft. Bowel sounds are normal.  Musculoskeletal: Normal range of motion. She exhibits no edema or tenderness.  Neurological: She is alert and oriented to person, place, and time. No cranial nerve deficit. She exhibits normal muscle tone. Coordination normal.  Skin: Skin is warm. She is not diaphoretic.  Psychiatric: She has a normal mood and affect. Her behavior is normal. Judgment and thought content normal.    ED Course  Procedures  (including critical care time) Labs Review Labs Reviewed - No data to display  Imaging Review No results found.   MDM   1. Intractable migraine without aura and without status migrainosus    Imitrex 6 mg subcutaneous and Decadron 10 mg IM and Tylenol 1000 mg orally given in clinic. Patient to start Zofran at home and consider Excedrin. Fluids, rest. Patient seek emergent medical attention if symptoms continue to worsen.  Work note provided Follow-up with PCP on March 1 for further migraine prophylaxis.  Shelly Flattenavid Kambree Krauss, MD Family Medicine 09/14/2014, 10:17 AM   Precautions given and all questions answered     Ozella Rocksavid J Tywaun Hiltner, MD 09/14/14 1017

## 2014-09-14 NOTE — Discharge Instructions (Signed)
You had a significant migraine which will require medications at rest in order to resolve. Your given Imitrex, a steroid, and Tylenol in order to help relieve this. Please go home and rest. Stay well hydrated. Consider using the Zofran and Excedrin for further relief. Please follow-up with her primary care physician and possibly a neurologist. Presented to the emergency room if her symptoms get worse.

## 2015-05-13 ENCOUNTER — Emergency Department (HOSPITAL_COMMUNITY)
Admission: EM | Admit: 2015-05-13 | Discharge: 2015-05-13 | Disposition: A | Discharge status: Home / Self Care | Payer: Self-pay | Attending: Emergency Medicine | Admitting: Emergency Medicine

## 2015-05-13 ENCOUNTER — Encounter (HOSPITAL_COMMUNITY): Payer: Self-pay

## 2015-05-13 DIAGNOSIS — Y929 Unspecified place or not applicable: Secondary | ICD-10-CM | POA: Insufficient documentation

## 2015-05-13 DIAGNOSIS — Y939 Activity, unspecified: Secondary | ICD-10-CM | POA: Insufficient documentation

## 2015-05-13 DIAGNOSIS — X58XXXA Exposure to other specified factors, initial encounter: Secondary | ICD-10-CM | POA: Insufficient documentation

## 2015-05-13 DIAGNOSIS — Z79899 Other long term (current) drug therapy: Secondary | ICD-10-CM | POA: Insufficient documentation

## 2015-05-13 DIAGNOSIS — T7840XA Allergy, unspecified, initial encounter: Secondary | ICD-10-CM | POA: Insufficient documentation

## 2015-05-13 DIAGNOSIS — J45909 Unspecified asthma, uncomplicated: Secondary | ICD-10-CM | POA: Insufficient documentation

## 2015-05-13 DIAGNOSIS — Y999 Unspecified external cause status: Secondary | ICD-10-CM | POA: Insufficient documentation

## 2015-05-13 DIAGNOSIS — Z8659 Personal history of other mental and behavioral disorders: Secondary | ICD-10-CM | POA: Insufficient documentation

## 2015-05-13 DIAGNOSIS — G43909 Migraine, unspecified, not intractable, without status migrainosus: Secondary | ICD-10-CM | POA: Insufficient documentation

## 2015-05-13 DIAGNOSIS — R21 Rash and other nonspecific skin eruption: Secondary | ICD-10-CM

## 2015-05-13 DIAGNOSIS — E079 Disorder of thyroid, unspecified: Secondary | ICD-10-CM | POA: Insufficient documentation

## 2015-05-13 DIAGNOSIS — I10 Essential (primary) hypertension: Secondary | ICD-10-CM | POA: Insufficient documentation

## 2015-05-13 MED ORDER — HYDROXYZINE HCL 25 MG PO TABS: 25.0000 mg | ORAL_TABLET | Freq: Four times a day (QID) | ORAL | Status: DC | PRN

## 2015-05-13 MED ORDER — DIPHENHYDRAMINE HCL 25 MG PO CAPS
25.0000 mg | ORAL_CAPSULE | Freq: Once | ORAL | Status: AC
  Administered 2015-05-13: 25 mg via ORAL
  Filled 2015-05-13: qty 1

## 2015-05-13 MED ORDER — GI COCKTAIL ~~LOC~~
30.0000 mL | Freq: Once | ORAL | Status: AC
  Administered 2015-05-13: 30 mL via ORAL
  Filled 2015-05-13: qty 30

## 2015-05-13 MED ORDER — DEXAMETHASONE SODIUM PHOSPHATE 10 MG/ML IJ SOLN
10.0000 mg | Freq: Once | INTRAMUSCULAR | Status: AC
  Administered 2015-05-13: 10 mg via INTRAMUSCULAR
  Filled 2015-05-13: qty 1

## 2015-05-13 MED ORDER — PREDNISONE 10 MG (21) PO TBPK: 10.0000 mg | ORAL_TABLET | Freq: Every day | ORAL | Status: DC

## 2015-05-13 MED ORDER — DIPHENHYDRAMINE HCL 25 MG PO TABS: 25.0000 mg | ORAL_TABLET | Freq: Four times a day (QID) | ORAL | Status: DC

## 2015-05-13 NOTE — ED Notes (Signed)
Pt. Developed  Few bumps on Thursday, Friday woke up with her lips swollen and itchy and this morning the lips are better but she has a rash all over and she  Is very itchy.  She took Benadryl yesterday and Pepcid last night.  Pt. Has not taken anything today.   Developed heart burn after she took Pepcid.

## 2015-05-13 NOTE — ED Notes (Signed)
Pt comfortable with discharge and follow up instructions. Pt declines wheelchair, escorted to waiting area by this RN. Prescriptions x3. 

## 2015-05-13 NOTE — ED Notes (Signed)
Pt's lips were swollen on Friday morning when she woke up, and has had hives/rash on arms/legs/trunk-- has not changed soaps, eaten anything different, has not taken any different medicine, did take pepcid AC since Thursday when hives started.

## 2015-05-13 NOTE — ED Provider Notes (Signed)
CSN: [id number]     Arrival date & time 05/13/15  1124 History   First MD Initiated Contact with Patient 05/13/15 1143     Chief Complaint  Patient presents with  . Rash     (Consider location/radiation/quality/duration/timing/severity/associated sxs/prior Treatment) The history is provided by the patient.     Pt p/w pruritic rash that has developed over the past 3 days.  States she initially had a few pruritic bumps on the left arm 3 days ago, started taking famotidine for allergic reaction (saw on the internet that it could help with itchy rash), that night developed a "knot" on her lip and itching of the lips, yesterday morning she had swelling of her lips.  She took benadryl with improvement.  Today she woke up covered in pururitic rash.  Notes her lips are back to normal.  Denies any difficulty swallowing or breathing.    Denies changes in personal care products including detergents, soaps, shampoos, lotions, perfumes. Denies new clothing or furniture.  Denies travel, visiting other people's houses.  Denies any recent camping or time spent in the woods.  Denies known tick bites.  Denies chemical or plant exposures.  Denies new foods.  Denies any new medications or medication changes with exception of the pepcid and benadryl she took after the rash began.    Past Medical History  Diagnosis Date  . Thyroid disease   . Hypertension   . Depression   . Asthma   . Migraine    History reviewed. No pertinent past surgical history. Family History  Problem Relation Age of Onset  . Asthma Mother   . Thyroid disease Mother   . Hypertension Mother   . Migraines Mother   . Hypertension Father    Social History  Substance Use Topics  . Smoking status: Never Smoker   . Smokeless tobacco: None  . Alcohol Use: Yes   OB History    No data available     Review of Systems  All other systems reviewed and are negative.     Allergies  Review of patient's allergies indicates no known  allergies.  Home Medications   Prior to Admission medications   Medication Sig Start Date End Date Taking? Authorizing Provider  albuterol (PROVENTIL HFA;VENTOLIN HFA) 108 (90 BASE) MCG/ACT inhaler Inhale 2 puffs into the lungs every 6 (six) hours as needed. For shortness of breath or wheezing    Historical Provider, MD  hydrochlorothiazide (HYDRODIURIL) 50 MG tablet Take 50 mg by mouth daily.    Historical Provider, MD  levothyroxine (SYNTHROID, LEVOTHROID) 25 MCG tablet Take 25 mcg by mouth daily.    Historical Provider, MD  megestrol (MEGACE) 40 MG tablet Take 1 tablet (40 mg total) by mouth daily as needed. For heavy bleeding or if menses lasts longer than 10 days 07/25/12   Verita Schneiders Key, NP  naproxen sodium (ANAPROX) 220 MG tablet Take 440 mg by mouth 2 (two) times daily as needed. For headache    Historical Provider, MD  ondansetron (ZOFRAN) 4 MG tablet Take 1 tablet (4 mg total) by mouth every 8 (eight) hours as needed for nausea or vomiting. 09/14/14   Ozella Rocks, MD  Phenylephrine-Acetaminophen (SINUS CONGESTION/PAIN DAYTIME PO) Take 2 tablets by mouth every 4 (four) hours as needed (for sinus congestion).    Historical Provider, MD  rizatriptan (MAXALT) 5 MG tablet Take 5 mg by mouth as needed for migraine. May repeat in 2 hours if needed    Historical Provider,  MD   BP 150/79 mmHg  Pulse 94  Temp(Src) 98.2 F (36.8 C) (Oral)  Resp 16  Ht 5\' 8"  (1.727 m)  Wt 382 lb 7 oz (173.473 kg)  BMI 58.16 kg/m2  SpO2 98%  LMP 04/24/2015 Physical Exam  Constitutional: She appears well-developed and well-nourished. No distress.  HENT:  Head: Normocephalic and atraumatic.  Mouth/Throat: Oropharynx is clear and moist. No oropharyngeal exudate.  Lips without edema.   Neck: Neck supple.  Pulmonary/Chest: Effort normal.  Neurological: She is alert.  Skin: Rash noted. She is not diaphoretic.  Raised erythematous plaques and papules over lower extremities, back and buttocks.  Papules over  upper extremities.    Face without lesions.    Nursing note and vitals reviewed.   ED Course  Procedures (including critical care time) Labs Review Labs Reviewed - No data to display  Imaging Review No results found. I have personally reviewed and evaluated these images and lab results as part of my medical decision-making.   EKG Interpretation None      MDM   Final diagnoses:  Rash  Allergic reaction, initial encounter    Afebrile, nontoxic patient with pruritic rash and allergic reaction without airway concerns.  IM decadron, PO benadryl given.   D/C home with prednisone, benadryl, vistaril.  Allergist referral.  Discussed result, findings, treatment, and follow up  with patient.  Pt given return precautions.  Pt verbalizes understanding and agrees with plan.         Trixie Dredgemily Marlyn Rabine, PA-C 05/13/15 1519  Margarita Grizzleanielle Ray, MD 05/15/15 1320

## 2015-05-13 NOTE — Discharge Instructions (Signed)
Read the information below.  Use the prescribed medication as directed.  Please discuss all new medications with your pharmacist.  You may return to the Emergency Department at any time for worsening condition or any new symptoms that concern you.    If you develop itching or swelling in your mouth or throat or any difficulty swallowing or breathing, call 911 or return to the Emergency Department immediately for a recheck.    ° ° °Allergies °An allergy is an abnormal reaction to a substance by the body's defense system (immune system). Allergies can develop at any age. °WHAT CAUSES ALLERGIES? °An allergic reaction happens when the immune system mistakenly reacts to a normally harmless substance, called an allergen, as if it were harmful. The immune system releases antibodies to fight the substance. Antibodies eventually release a chemical called histamine into the bloodstream. The release of histamine is meant to protect the body from infection, but it also causes discomfort. °An allergic reaction can be triggered by: °· Eating an allergen. °· Inhaling an allergen. °· Touching an allergen. °WHAT TYPES OF ALLERGIES ARE THERE? °There are many types of allergies. Common types include: °· Seasonal allergies. People with this type of allergy are usually allergic to substances that are only present during certain seasons, such as molds and pollens. °· Food allergies. °· Drug allergies. °· Insect allergies. °· Animal dander allergies. °WHAT ARE SYMPTOMS OF ALLERGIES? °Possible allergy symptoms include: °· Swelling of the lips, face, tongue, mouth, or throat. °· Sneezing, coughing, or wheezing. °· Nasal congestion. °· Tingling in the mouth. °· Rash. °· Itching. °· Itchy, red, swollen areas of skin (hives). °· Watery eyes. °· Vomiting. °· Diarrhea. °· Dizziness. °· Lightheadedness. °· Fainting. °· Trouble breathing or swallowing. °· Chest tightness. °· Rapid heartbeat. °HOW ARE ALLERGIES DIAGNOSED? °Allergies are diagnosed  with a medical and family history and one or more of the following: °· Skin tests. °· Blood tests. °· A food diary. A food diary is a record of all the foods and drinks you have in a day and of all the symptoms you experience. °· The results of an elimination diet. An elimination diet involves eliminating foods from your diet and then adding them back in one by one to find out if a certain food causes an allergic reaction. °HOW ARE ALLERGIES TREATED? °There is no cure for allergies, but allergic reactions can be treated with medicine. Severe reactions usually need to be treated at a hospital. °HOW CAN REACTIONS BE PREVENTED? °The best way to prevent an allergic reaction is by avoiding the substance you are allergic to. Allergy shots and medicines can also help prevent reactions in some cases. People with severe allergic reactions may be able to prevent a life-threatening reaction called anaphylaxis with a medicine given right after exposure to the allergen. °  °This information is not intended to replace advice given to you by your health care provider. Make sure you discuss any questions you have with your health care provider. °  °Document Released: 10/08/2002 Document Revised: 08/05/2014 Document Reviewed: 04/26/2014 °[employer] Interactive Patient Education ©2016 [employer] ° °

## 2021-02-20 ENCOUNTER — Encounter (HOSPITAL_BASED_OUTPATIENT_CLINIC_OR_DEPARTMENT_OTHER): Payer: Self-pay | Admitting: Emergency Medicine

## 2021-02-20 ENCOUNTER — Emergency Department (HOSPITAL_BASED_OUTPATIENT_CLINIC_OR_DEPARTMENT_OTHER)
Admission: EM | Admit: 2021-02-20 | Discharge: 2021-02-20 | Disposition: A | Discharge status: Home / Self Care | Payer: Medicaid Other | Attending: Emergency Medicine | Admitting: Emergency Medicine

## 2021-02-20 DIAGNOSIS — F17210 Nicotine dependence, cigarettes, uncomplicated: Secondary | ICD-10-CM | POA: Diagnosis not present

## 2021-02-20 DIAGNOSIS — Z79899 Other long term (current) drug therapy: Secondary | ICD-10-CM | POA: Diagnosis not present

## 2021-02-20 DIAGNOSIS — U071 COVID-19: Secondary | ICD-10-CM

## 2021-02-20 DIAGNOSIS — I10 Essential (primary) hypertension: Secondary | ICD-10-CM | POA: Diagnosis not present

## 2021-02-20 DIAGNOSIS — J45909 Unspecified asthma, uncomplicated: Secondary | ICD-10-CM | POA: Diagnosis not present

## 2021-02-20 DIAGNOSIS — R519 Headache, unspecified: Secondary | ICD-10-CM | POA: Diagnosis present

## 2021-02-20 LAB — RESP PANEL BY RT-PCR (FLU A&B, COVID) ARPGX2
Influenza A by PCR: NEGATIVE
Influenza B by PCR: NEGATIVE
SARS Coronavirus 2 by RT PCR: POSITIVE — AB

## 2021-02-20 MED ORDER — ACETAMINOPHEN 325 MG PO TABS
650.0000 mg | ORAL_TABLET | Freq: Once | ORAL | Status: AC
  Administered 2021-02-20: 650 mg via ORAL
  Filled 2021-02-20: qty 2

## 2021-02-20 MED ORDER — DEXAMETHASONE SODIUM PHOSPHATE 10 MG/ML IJ SOLN
10.0000 mg | Freq: Once | INTRAMUSCULAR | Status: AC
  Administered 2021-02-20: 10 mg via INTRAVENOUS
  Filled 2021-02-20: qty 1

## 2021-02-20 MED ORDER — PROCHLORPERAZINE EDISYLATE 10 MG/2ML IJ SOLN
10.0000 mg | Freq: Once | INTRAMUSCULAR | Status: AC
  Administered 2021-02-20: 10 mg via INTRAVENOUS
  Filled 2021-02-20: qty 2

## 2021-02-20 MED ORDER — DIPHENHYDRAMINE HCL 50 MG/ML IJ SOLN
25.0000 mg | Freq: Once | INTRAMUSCULAR | Status: AC
  Administered 2021-02-20: 25 mg via INTRAVENOUS
  Filled 2021-02-20: qty 1

## 2021-02-20 MED ORDER — SODIUM CHLORIDE 0.9 % IV BOLUS
1000.0000 mL | Freq: Once | INTRAVENOUS | Status: AC
  Administered 2021-02-20: 1000 mL via INTRAVENOUS

## 2021-02-20 NOTE — ED Provider Notes (Signed)
[facility] EMERGENCY DEPT Provider Note   CSN: [id number] Arrival date & time: 02/20/21  1456     History Chief Complaint  Patient presents with   Migraine    Teresa Snyder is a 35 y.o. female.  Here with body aches and headache.  History of migraines.  No known sick contacts.  No neck pain.  Having some nausea and vomiting as well.  Feels like her prior migraines.  Does not think that she has COVID.  The history is provided by the patient.  Migraine This is a recurrent problem. The problem occurs constantly. The problem has not changed since onset.Associated symptoms include headaches. Pertinent negatives include no chest pain, no abdominal pain and no shortness of breath.      Past Medical History:  Diagnosis Date   Asthma    Depression    Hypertension    Migraine    Thyroid disease     There are no problems to display for this patient.   History reviewed. No pertinent surgical history.   OB History   No obstetric history on file.     Family History  Problem Relation Age of Onset   Asthma Mother    Thyroid disease Mother    Hypertension Mother    Migraines Mother    Hypertension Father     Social History   Tobacco Use   Smoking status: Every Day    Types: Cigarettes   Smokeless tobacco: Never  Substance Use Topics   Alcohol use: Yes   Drug use: No    Home Medications Prior to Admission medications   Medication Sig Start Date End Date Taking? Authorizing Provider  albuterol (PROVENTIL HFA;VENTOLIN HFA) 108 (90 BASE) MCG/ACT inhaler Inhale 2 puffs into the lungs every 6 (six) hours as needed. For shortness of breath or wheezing    [provider]  diphenhydrAMINE (BENADRYL) 25 MG tablet Take 1 tablet (25 mg total) by mouth every 6 (six) hours. X 3 days then PRN 05/13/15   West, Emily, PA-C  hydrochlorothiazide (HYDRODIURIL) 50 MG tablet Take 50 mg by mouth daily.    [provider]  hydrOXYzine (ATARAX/VISTARIL) 25  MG tablet Take 1 tablet (25 mg total) by mouth every 6 (six) hours as needed for itching. 05/13/15   Trixie Dredge, PA-C  levothyroxine (SYNTHROID, LEVOTHROID) 25 MCG tablet Take 25 mcg by mouth daily.    [provider]  megestrol (MEGACE) 40 MG tablet Take 1 tablet (40 mg total) by mouth daily as needed. For heavy bleeding or if menses lasts longer than 10 days 07/25/12   Key, Verita Schneiders, NP  naproxen sodium (ANAPROX) 220 MG tablet Take 440 mg by mouth 2 (two) times daily as needed. For headache    [provider]  ondansetron (ZOFRAN) 4 MG tablet Take 1 tablet (4 mg total) by mouth every 8 (eight) hours as needed for nausea or vomiting. 09/14/14   Ozella Rocks, MD  Phenylephrine-Acetaminophen (SINUS CONGESTION/PAIN DAYTIME PO) Take 2 tablets by mouth every 4 (four) hours as needed (for sinus congestion).    [provider]  predniSONE (STERAPRED UNI-PAK 21 TAB) 10 MG (21) TBPK tablet Take 1 tablet (10 mg total) by mouth daily. Day 1: take 6 tabs.  Day 2: 5 tabs  Day 3: 4 tabs  Day 4: 3 tabs  Day 5: 2 tabs  Day 6: 1 tab 05/13/15   West, Emily, PA-C  rizatriptan (MAXALT) 5 MG tablet Take 5 mg by mouth  as needed for migraine. May repeat in 2 hours if needed    [provider]    Allergies    Patient has no known allergies.  Review of Systems   Review of Systems  Constitutional:  Negative for chills and fever.  HENT:  Negative for ear pain and sore throat.   Eyes:  Negative for pain and visual disturbance.  Respiratory:  Negative for cough and shortness of breath.   Cardiovascular:  Negative for chest pain and palpitations.  Gastrointestinal:  Negative for abdominal pain and vomiting.  Genitourinary:  Negative for dysuria and hematuria.  Musculoskeletal:  Positive for myalgias. Negative for arthralgias and back pain.  Skin:  Negative for color change and rash.  Neurological:  Positive for headaches. Negative for dizziness, seizures, syncope, facial asymmetry  and light-headedness.  All other systems reviewed and are negative.  Physical Exam Updated Vital Signs BP (!) 87/47 (BP Location: Right Arm)   Pulse 80   Temp 99.2 F (37.3 C) (Oral)   Resp 12   Ht 5\' 6"  (1.676 m)   Wt 90.7 kg   LMP 04/24/2015   SpO2 100%   BMI 32.28 kg/m   Physical Exam Vitals and nursing note reviewed.  Constitutional:      General: She is not in acute distress.    Appearance: She is well-developed. She is not ill-appearing.  HENT:     Head: Normocephalic and atraumatic.     Nose: Nose normal.     Mouth/Throat:     Mouth: Mucous membranes are moist.  Eyes:     Extraocular Movements: Extraocular movements intact.     Conjunctiva/sclera: Conjunctivae normal.     Pupils: Pupils are equal, round, and reactive to light.  Cardiovascular:     Rate and Rhythm: Normal rate and regular rhythm.     Pulses: Normal pulses.     Heart sounds: Normal heart sounds. No murmur heard. Pulmonary:     Effort: Pulmonary effort is normal. No respiratory distress.     Breath sounds: Normal breath sounds.  Abdominal:     Palpations: Abdomen is soft.     Tenderness: There is no abdominal tenderness.  Musculoskeletal:     Cervical back: Normal range of motion and neck supple. No rigidity.  Skin:    General: Skin is warm and dry.     Capillary Refill: Capillary refill takes less than 2 seconds.  Neurological:     General: No focal deficit present.     Mental Status: She is alert and oriented to person, place, and time.     Cranial Nerves: No cranial nerve deficit.     Sensory: No sensory deficit.     Motor: No weakness.     Coordination: Coordination normal.     Comments: 5+ out of 5 strength throughout, normal sensation, no drift, normal finger-to-nose finger    ED Results / Procedures / Treatments   Labs (all labs ordered are listed, but only abnormal results are displayed) Labs Reviewed  RESP PANEL BY RT-PCR (FLU A&B, COVID) ARPGX2 - Abnormal; Notable for the  following components:      Result Value   SARS Coronavirus 2 by RT PCR POSITIVE (*)    All other components within normal limits    EKG None  Radiology No results found.  Procedures Procedures   Medications Ordered in ED Medications  sodium chloride 0.9 % bolus 1,000 mL (1,000 mLs Intravenous New Bag/Given 02/20/21 1520)  prochlorperazine (COMPAZINE) injection 10 mg (  10 mg Intravenous Given 02/20/21 1527)  diphenhydrAMINE (BENADRYL) injection 25 mg (25 mg Intravenous Given 02/20/21 1527)  dexamethasone (DECADRON) injection 10 mg (10 mg Intravenous Given 02/20/21 1524)  acetaminophen (TYLENOL) tablet 650 mg (650 mg Oral Given 02/20/21 1530)    ED Course  I have reviewed the triage vital signs and the nursing notes.  Pertinent labs & imaging results that were available during my care of the patient were reviewed by me and considered in my medical decision making (see chart for details).    MDM Rules/Calculators/A&P                           Javonda Suh is here with migraine, body aches.  History of migraines.  Overall unremarkable vitals.  Normal neurological exam.  No concern for meningitis.  She has viral symptoms and suspect COVID.  COVID test was confirmed positive.  She is given headache cocktail and IV fluids with great improvement.  She understands return precautions and was discharged in ED in good condition.  This chart was dictated using voice recognition software.  Despite best efforts to proofread,  errors can occur which can change the documentation meaning.   Final Clinical Impression(s) / ED Diagnoses Final diagnoses:  COVID-19    Rx / DC Orders ED Discharge Orders     None        Virgina Norfolk, DO 02/20/21 1623

## 2021-02-20 NOTE — ED Notes (Signed)
Per EMS.  pt awoke at 2am this morning with nausea, vomiting and migraine.    BP 130/80, hr 94, O2 98%, CBG112, 97.9F AAx4

## 2022-06-13 ENCOUNTER — Ambulatory Visit: Payer: Medicaid Other | Admitting: Pulmonary Disease

## 2022-06-13 ENCOUNTER — Ambulatory Visit (INDEPENDENT_AMBULATORY_CARE_PROVIDER_SITE_OTHER): Payer: Medicaid Other

## 2022-06-13 ENCOUNTER — Encounter: Payer: Self-pay | Admitting: Pulmonary Disease

## 2022-06-13 VITALS — BP 112/60 | HR 71 | Temp 98.4°F | Ht 67.0 in | Wt 224.2 lb

## 2022-06-13 DIAGNOSIS — F17210 Nicotine dependence, cigarettes, uncomplicated: Secondary | ICD-10-CM

## 2022-06-13 DIAGNOSIS — R0609 Other forms of dyspnea: Secondary | ICD-10-CM

## 2022-06-13 MED ORDER — IPRATROPIUM-ALBUTEROL 0.5-2.5 (3) MG/3ML IN SOLN: 3.0000 mL | Freq: Four times a day (QID) | RESPIRATORY_TRACT | 11 refills | Status: DC | PRN

## 2022-06-13 MED ORDER — SPIRIVA RESPIMAT 2.5 MCG/ACT IN AERS: 2.0000 | INHALATION_SPRAY | Freq: Every day | RESPIRATORY_TRACT | 11 refills | Status: DC

## 2022-06-13 NOTE — Progress Notes (Signed)
@Patient  ID: , female    DOB: [birth year], 36 y.o.   MRN: [id number]  Chief Complaint  Patient presents with   Consult    Post covid, cough,sob since 2020. Has convcerns about elevated heart rate. Pt states she is really dependent on inhalers     Referring provider: 2021, PA-C  HPI:   36 y.o. woman whom are seen in consultation for evaluation of poorly controlled asthma and dyspnea on exertion.  Most recent PCP note reviewed.  Patient has been struggling with asthma.  Diagnosed earlier in life.  Since viral infection, likely COVID early 2020, she had poorly controlled asthma.  Several prednisone courses a year.  This does help.  Uses albuterol and DuoNebs frequently.  At least twice a day on albuterol, DuoNebs every night.  No time of day when things are better or worse.  No position make things better or worse.  No seasonal environmental factors she can identify that make things better or worse.  Symbicort helps some.  Showing bronchiolitis help some with shortness of breath and the cough.  Are beneficial when she uses them.  Prickly at night, uses DuoNeb before bed which helps stop her from gasping for air, coughing throughout the night.  She does keep her supply of prednisone on hand.  Often will use 5 to 10 mg a day at a time or so when symptoms are bad.  Most recently within the last week or 2.  No other alleviating or exacerbating factors.  Most recent chest imaging chest x-ray 2010 reveals clear lungs bilaterally with evidence of hyperinflation on my review and interpretation.  PMH: Tobacco abuse, asthma, hypothyroid Surgical history:History reviewed. No pertinent surgical history. Family history: Mother with hypertension, asthma, thyroid disease, father with hypertension Social history: Current smoker, actively decreasing cigarettes smoked per day, lives in [place] / Pulmonary Flowsheets:   ACT:      No data to display           MMRC:     No data to display          Epworth:      No data to display          Tests:   FENO:  No results found for: "NITRICOXIDE"  PFT:     No data to display          WALK:      No data to display          Imaging: Personally reviewed and as per EMR discussion this note No results found.  Lab Results: Personally reviewed CBC    Component Value Date/Time   WBC 8.1 07/25/2012 1450   RBC 4.57 07/25/2012 1450   HGB 12.9 07/25/2012 1450   HCT 39.4 07/25/2012 1450   PLT 270 07/25/2012 1450   MCV 86.2 07/25/2012 1450   MCH 28.2 07/25/2012 1450   MCHC 32.7 07/25/2012 1450   RDW 13.6 07/25/2012 1450    BMET No results found for: "NA", "K", "CL", "CO2", "GLUCOSE", "BUN", "CREATININE", "CALCIUM", "GFRNONAA", "GFRAA"  BNP No results found for: "BNP"  ProBNP No results found for: "PROBNP"  Specialty Problems   None   No Known Allergies  Immunization History  Administered Date(s) Administered   Influenza,inj,Quad PF,6+ Mos 07/08/2016    Past Medical History:  Diagnosis Date   Asthma    Depression    Hypertension    Migraine    Thyroid disease     Tobacco History:  Social History   Tobacco Use  Smoking Status Every Day   Types: Cigarettes  Smokeless Tobacco Never   Ready to quit: Not Answered Counseling given: Not Answered   Continue to not smoke  Outpatient Encounter Medications as of 06/13/2022  Medication Sig   albuterol (PROVENTIL HFA;VENTOLIN HFA) 108 (90 BASE) MCG/ACT inhaler Inhale 2 puffs into the lungs every 6 (six) hours as needed. For shortness of breath or wheezing   budesonide-formoterol (SYMBICORT) 80-4.5 MCG/ACT inhaler Inhale 2 puffs into the lungs as needed.   EPINEPHrine 0.3 mg/0.3 mL IJ SOAJ injection Inject 0.3 mg into the muscle as needed.   famotidine (PEPCID) 20 MG tablet Take 20 mg by mouth daily.   ipratropium-albuterol (DUONEB) 0.5-2.5 (3) MG/3ML SOLN Take 3 mLs by nebulization every 6 (six)  hours as needed.   levocetirizine (XYZAL) 5 MG tablet Take 1 tablet by mouth every evening.   levothyroxine (SYNTHROID, LEVOTHROID) 25 MCG tablet Take 112 mcg by mouth daily.   montelukast (SINGULAIR) 10 MG tablet Take 1 tablet by mouth daily.   naproxen sodium (ANAPROX) 220 MG tablet Take 440 mg by mouth 2 (two) times daily as needed. For headache   pantoprazole (PROTONIX) 40 MG tablet Take 1 tablet by mouth every morning.   predniSONE (STERAPRED UNI-PAK 21 TAB) 10 MG (21) TBPK tablet Take 1 tablet (10 mg total) by mouth daily. Day 1: take 6 tabs.  Day 2: 5 tabs  Day 3: 4 tabs  Day 4: 3 tabs  Day 5: 2 tabs  Day 6: 1 tab   solifenacin (VESICARE) 5 MG tablet Take 1 tablet by mouth daily.   Tiotropium Bromide Monohydrate (SPIRIVA RESPIMAT) 2.5 MCG/ACT AERS Inhale 2 puffs into the lungs daily.   zonisamide (ZONEGRAN) 25 MG capsule Take 2 capsules by mouth daily.   buPROPion (WELLBUTRIN XL) 150 MG 24 hr tablet Take 150 mg by mouth daily.   busPIRone (BUSPAR) 10 MG tablet 10 mg 2 (two) times daily.   diphenhydrAMINE (BENADRYL) 25 MG tablet Take 1 tablet (25 mg total) by mouth every 6 (six) hours. X 3 days then PRN (Patient not taking: Reported on 06/13/2022)   hydrochlorothiazide (HYDRODIURIL) 50 MG tablet Take 50 mg by mouth daily. (Patient not taking: Reported on 06/13/2022)   hydrOXYzine (ATARAX/VISTARIL) 25 MG tablet Take 1 tablet (25 mg total) by mouth every 6 (six) hours as needed for itching. (Patient not taking: Reported on 06/13/2022)   lidocaine (LIDODERM) 5 % Place 2 patches onto the skin.   megestrol (MEGACE) 40 MG tablet Take 1 tablet (40 mg total) by mouth daily as needed. For heavy bleeding or if menses lasts longer than 10 days (Patient not taking: Reported on 06/13/2022)   ondansetron (ZOFRAN) 4 MG tablet Take 1 tablet (4 mg total) by mouth every 8 (eight) hours as needed for nausea or vomiting. (Patient not taking: Reported on 06/13/2022)   Phenylephrine-Acetaminophen (SINUS  CONGESTION/PAIN DAYTIME PO) Take 2 tablets by mouth every 4 (four) hours as needed (for sinus congestion). (Patient not taking: Reported on 06/13/2022)   rizatriptan (MAXALT) 5 MG tablet Take 5 mg by mouth as needed for migraine. May repeat in 2 hours if needed (Patient not taking: Reported on 06/13/2022)   No facility-administered encounter medications on file as of 06/13/2022.     Review of Systems  Review of Systems  No chest pain with rest.  No orthopnea or PND.  Comprehensive review of systems otherwise negative. Physical Exam  BP 112/60 (BP Location: Left  Arm, Cuff Size: Normal)   Pulse 71   Temp 98.4 F (36.9 C) (Oral)   Ht 5\' 7"  (1.702 m)   Wt 224 lb 3.2 oz (101.7 kg)   LMP 04/24/2015   SpO2 100%   BMI 35.11 kg/m   Wt Readings from Last 5 Encounters:  06/13/22 224 lb 3.2 oz (101.7 kg)  02/20/21 200 lb (90.7 kg)  05/13/15 (!) 382 lb 7 oz (173.5 kg)  11/10/13 (!) 419 lb 3.2 oz (190.1 kg)  07/25/12 (!) 419 lb 9.6 oz (190.3 kg)    BMI Readings from Last 5 Encounters:  06/13/22 35.11 kg/m  02/20/21 32.28 kg/m  05/13/15 58.15 kg/m  11/10/13 63.74 kg/m  07/25/12 63.80 kg/m     Physical Exam General: Sitting in chair, no acute distress Eyes: EOMI, no icterus Neck: Supple, no JVP Cardiovascular: Warm, no edema Pulmonary: Clear, prolonged expiratory phase Abdomen: Nondistended, bowel sounds present MSK: No synovitis, no joint effusion Neuro: Normal gait, no weakness Psych: Normal mood, full affect   Assessment & Plan:   Severe persistent asthma: With frequent rescue inhaler use.  DuoNebs and albuterol provides improvement in symptoms.  Continue Symbicort high-dose 2 puff twice daily.  Add high-dose Breo for additional bronchodilation given relief of symptoms with short acting bronchodilators.  Would benefit from phenotyping but recent prednisone use we will hold off for now.  Dyspnea on exertion: Likely related to asthma, uncontrolled as above.  Chest x-ray  today, PFTs in the coming weeks for further evaluation.  Assess response to medication changes above.  Smoking assessment and cessation counseling  I have advised the patient to quit/stop smoking as soon as possible due to high risk for multiple medical problems.  It will also be very difficult for 07/27/12 to manage patient's  respiratory symptoms and status if we continue to expose her lungs to a known irritant.  We do not advise e-cigarettes as a form of stopping smoking. Patient is willing to quit smoking. I have advised the patient that we can assist and have options of nicotine replacement therapy, provided smoking cessation education today, provided smoking cessation counseling, and provided cessation resources. Follow-up next office visit office visit for assessment of smoking cessation.  I spent 3 minutes in smoking cessation counseling.    Return in about 2 months (around 08/13/2022).   08/15/2022, MD 06/13/2022

## 2022-06-13 NOTE — Patient Instructions (Addendum)
Nice to meet you  I added a new inhaler - Spiriva 2 puff once daily  Continue Symbicort 2 puffs twice a day  Continue Duonebs as needed and albuterol as needed  I refilled the duonebs  CXR today  I ordered pulmonary function tests  Return to clinic in 2 months or sooner as needed with Dr. Judeth Horn

## 2022-08-15 ENCOUNTER — Encounter: Payer: Self-pay | Admitting: Pulmonary Disease

## 2022-08-15 ENCOUNTER — Ambulatory Visit (INDEPENDENT_AMBULATORY_CARE_PROVIDER_SITE_OTHER): Payer: Medicaid Other | Admitting: Pulmonary Disease

## 2022-08-15 VITALS — BP 138/72 | HR 63 | Temp 98.7°F | Ht 67.0 in | Wt 222.0 lb

## 2022-08-15 DIAGNOSIS — R0609 Other forms of dyspnea: Secondary | ICD-10-CM

## 2022-08-15 DIAGNOSIS — J4540 Moderate persistent asthma, uncomplicated: Secondary | ICD-10-CM | POA: Diagnosis not present

## 2022-08-15 LAB — PULMONARY FUNCTION TEST
DL/VA % pred: 114 %
DL/VA: 5.04 ml/min/mmHg/L
DLCO cor % pred: 104 %
DLCO cor: 25.5 ml/min/mmHg
DLCO unc % pred: 104 %
DLCO unc: 25.5 ml/min/mmHg
FEF 25-75 Post: 4.22 L/sec
FEF 25-75 Pre: 2.24 L/sec
FEF2575-%Change-Post: 88 %
FEF2575-%Pred-Post: 123 %
FEF2575-%Pred-Pre: 65 %
FEV1-%Change-Post: 22 %
FEV1-%Pred-Post: 98 %
FEV1-%Pred-Pre: 80 %
FEV1-Post: 3.31 L
FEV1-Pre: 2.7 L
FEV1FVC-%Change-Post: 12 %
FEV1FVC-%Pred-Pre: 90 %
FEV6-%Change-Post: 9 %
FEV6-%Pred-Post: 97 %
FEV6-%Pred-Pre: 89 %
FEV6-Post: 3.92 L
FEV6-Pre: 3.59 L
FEV6FVC-%Pred-Post: 101 %
FEV6FVC-%Pred-Pre: 101 %
FVC-%Change-Post: 9 %
FVC-%Pred-Post: 95 %
FVC-%Pred-Pre: 87 %
FVC-Post: 3.92 L
FVC-Pre: 3.59 L
Post FEV1/FVC ratio: 84 %
Post FEV6/FVC ratio: 100 %
Pre FEV1/FVC ratio: 75 %
Pre FEV6/FVC Ratio: 100 %
RV % pred: 121 %
RV: 2.04 L
TLC % pred: 102 %
TLC: 5.65 L

## 2022-08-15 NOTE — Patient Instructions (Addendum)
Nice to see you again  Pulmonary function tests show signs of asthma but otherwise are normal.  Okay to stop Spiriva, you can use it as needed if you find it more beneficial in those scenarios  No other changes to medicines  Return to clinic in 6 months or sooner as needed with Dr. Silas Flood

## 2022-08-15 NOTE — Progress Notes (Signed)
Full PFT performed today.

## 2022-08-15 NOTE — Progress Notes (Signed)
@Patient  ID: Teresa Snyder, female    DOB: 1986-06-07, 37 y.o.   MRN: [id number]  Chief Complaint  Patient presents with   Follow-up    Pft review, pt states that she is having some issues with the [place]. She states it gives her issues with her breathing.     Referring provider: Ruben Im, PA-C  HPI:   37 y.o. woman whom are seen in follow-up for evaluation of poorly controlled asthma and dyspnea on exertion.    Returns for routine follow-up.  PFTs performed today.  This demonstrates significant proved in FEV1 after bronchodilator therapy, otherwise normal, full interpretation below.  At last visit, he continued mid dose Symbicort and the Spiriva given improvement with bronchodilators.  She took Spiriva for about a week.  Now stopped.  When she is feeling relatively well, it seems to cause burning in her lungs when she breathes in.  Very just comforting.  Uncomfortable sensation.  Curiously, when she is feeling more ill and uses it does seem to help her shortness of breath.  She is also using DuoNebs and this really helps.  Symptoms seem much improved with Symbicort and DuoNebs currently.  HPI initial visit: Patient has been struggling with asthma.  Diagnosed earlier in life.  Since viral infection, likely COVID early 2020, she had poorly controlled asthma.  Several prednisone courses a year.  This does help.  Uses albuterol and DuoNebs frequently.  At least twice a day on albuterol, DuoNebs every night.  No time of day when things are better or worse.  No position make things better or worse.  No seasonal environmental factors she can identify that make things better or worse.  Symbicort helps some.  Showing bronchiolitis help some with shortness of breath and the cough.  Are beneficial when she uses them.  Prickly at night, uses DuoNeb before bed which helps stop her from gasping for air, coughing throughout the night.  She does keep her supply of prednisone on hand.  Often will  use 5 to 10 mg a day at a time or so when symptoms are bad.  Most recently within the last week or 2.  No other alleviating or exacerbating factors.  Most recent chest imaging chest x-ray 2010 reveals clear lungs bilaterally with evidence of hyperinflation on my review and interpretation.  PMH: Tobacco abuse, asthma, hypothyroid Surgical history:History reviewed. No pertinent surgical history. Family history: Mother with hypertension, asthma, thyroid disease, father with hypertension Social history: Current smoker, actively decreasing cigarettes smoked per day, lives in [place] / Pulmonary Flowsheets:   ACT:      No data to display          MMRC:     No data to display          Epworth:      No data to display          Tests:   FENO:  No results found for: "NITRICOXIDE"  PFT:    Latest Ref Rng & Units 08/15/2022   10:02 AM  PFT Results  FVC-Pre L 3.59  P  FVC-Predicted Pre % 87  P  FVC-Post L 3.92  P  FVC-Predicted Post % 95  P  Pre FEV1/FVC % % 75  P  Post FEV1/FCV % % 84  P  FEV1-Pre L 2.70  P  FEV1-Predicted Pre % 80  P  FEV1-Post L 3.31  P  DLCO uncorrected ml/min/mmHg 25.50  P  DLCO UNC% %  104  P  DLCO corrected ml/min/mmHg 25.50  P  DLCO COR %Predicted % 104  P  DLVA Predicted % 114  P  TLC L 5.65  P  TLC % Predicted % 102  P  RV % Predicted % 121  P    P Preliminary result  Personally reviewed and interpreted as normal spirometry, significant BD response in FEV1, volumes notable for signs of air trapping, DLCO within normal limits  WALK:      No data to display          Imaging: Personally reviewed and as per EMR discussion this note No results found.  Lab Results: Personally reviewed CBC    Component Value Date/Time   WBC 8.1 07/25/2012 1450   RBC 4.57 07/25/2012 1450   HGB 12.9 07/25/2012 1450   HCT 39.4 07/25/2012 1450   PLT 270 07/25/2012 1450   MCV 86.2 07/25/2012 1450   MCH 28.2 07/25/2012 1450    MCHC 32.7 07/25/2012 1450   RDW 13.6 07/25/2012 1450    BMET No results found for: "NA", "K", "CL", "CO2", "GLUCOSE", "BUN", "CREATININE", "CALCIUM", "GFRNONAA", "GFRAA"  BNP No results found for: "BNP"  ProBNP No results found for: "PROBNP"  Specialty Problems   None   No Known Allergies  Immunization History  Administered Date(s) Administered   Influenza,inj,Quad PF,6+ Mos 07/08/2016    Past Medical History:  Diagnosis Date   Asthma    Depression    Hypertension    Migraine    Thyroid disease     Tobacco History: Social History   Tobacco Use  Smoking Status Every Day   Types: Cigarettes  Smokeless Tobacco Never   Ready to quit: Not Answered Counseling given: Not Answered   Continue to not smoke  Outpatient Encounter Medications as of 08/15/2022  Medication Sig   albuterol (PROVENTIL HFA;VENTOLIN HFA) 108 (90 BASE) MCG/ACT inhaler Inhale 2 puffs into the lungs every 6 (six) hours as needed. For shortness of breath or wheezing   budesonide-formoterol (SYMBICORT) 80-4.5 MCG/ACT inhaler Inhale 2 puffs into the lungs as needed.   buPROPion (WELLBUTRIN XL) 150 MG 24 hr tablet Take 150 mg by mouth daily.   busPIRone (BUSPAR) 10 MG tablet 10 mg 2 (two) times daily.   EPINEPHrine 0.3 mg/0.3 mL IJ SOAJ injection Inject 0.3 mg into the muscle as needed.   famotidine (PEPCID) 20 MG tablet Take 20 mg by mouth daily.   ipratropium-albuterol (DUONEB) 0.5-2.5 (3) MG/3ML SOLN Take 3 mLs by nebulization every 6 (six) hours as needed.   levocetirizine (XYZAL) 5 MG tablet Take 1 tablet by mouth every evening.   levothyroxine (SYNTHROID, LEVOTHROID) 25 MCG tablet Take 112 mcg by mouth daily.   lidocaine (LIDODERM) 5 % Place 2 patches onto the skin.   montelukast (SINGULAIR) 10 MG tablet Take 1 tablet by mouth daily.   naproxen sodium (ANAPROX) 220 MG tablet Take 440 mg by mouth 2 (two) times daily as needed. For headache   pantoprazole (PROTONIX) 40 MG tablet Take 1 tablet by  mouth every morning.   predniSONE (STERAPRED UNI-PAK 21 TAB) 10 MG (21) TBPK tablet Take 1 tablet (10 mg total) by mouth daily. Day 1: take 6 tabs.  Day 2: 5 tabs  Day 3: 4 tabs  Day 4: 3 tabs  Day 5: 2 tabs  Day 6: 1 tab   solifenacin (VESICARE) 5 MG tablet Take 1 tablet by mouth daily.   [DISCONTINUED] zonisamide (ZONEGRAN) 25 MG capsule Take 2 capsules  by mouth daily.   [DISCONTINUED] Tiotropium Bromide Monohydrate (SPIRIVA RESPIMAT) 2.5 MCG/ACT AERS Inhale 2 puffs into the lungs daily. (Patient not taking: Reported on 08/15/2022)   No facility-administered encounter medications on file as of 08/15/2022.     Review of Systems  Review of Systems  N/a Physical Exam  Pulse 63   Ht 5\' 7"  (1.702 m)   Wt 222 lb (100.7 kg)   LMP 04/24/2015   SpO2 99%   BMI 34.77 kg/m   Wt Readings from Last 5 Encounters:  08/15/22 222 lb (100.7 kg)  06/13/22 224 lb 3.2 oz (101.7 kg)  02/20/21 200 lb (90.7 kg)  05/13/15 (!) 382 lb 7 oz (173.5 kg)  11/10/13 (!) 419 lb 3.2 oz (190.1 kg)    BMI Readings from Last 5 Encounters:  08/15/22 34.77 kg/m  06/13/22 35.11 kg/m  02/20/21 32.28 kg/m  05/13/15 58.15 kg/m  11/10/13 63.74 kg/m     Physical Exam General: Sitting in chair, no acute distress Eyes: EOMI, no icterus Neck: Supple, no JVP Cardiovascular: Warm, no edema Pulmonary: Clear, prolonged expiratory phase Abdomen: Nondistended, bowel sounds present MSK: No synovitis, no joint effusion Neuro: Normal gait, no weakness Psych: Normal mood, full affect   Assessment & Plan:   Severe persistent asthma: With frequent rescue inhaler use.  DuoNebs and albuterol provides improvement in symptoms.  Continue Symbicort 2 puff twice daily.  Spiriva added at last visit but had some adverse reaction, burning sensation in lungs after using when she feels well..  Curiously, it does seem to help when she is feeling more short of breath.  Okay to continue and Spiriva as needed.  Continue DuoNebs if  needed, this seems to help quite a bit.   Dyspnea on exertion: Likely related to asthma, uncontrolled as above.  Chest x-ray today, PFTs demonstrate air trapping and significant bronchodilator response consistent with asthma, no other concerning findings.     Return in about 6 months (around 02/13/2023).   02/15/2023, MD 08/15/2022

## 2022-08-15 NOTE — Patient Instructions (Signed)
Full PFT performed today.

## 2023-05-06 ENCOUNTER — Other Ambulatory Visit: Payer: Self-pay | Admitting: Family Medicine

## 2023-05-06 ENCOUNTER — Ambulatory Visit
Admission: RE | Admit: 2023-05-06 | Discharge: 2023-05-06 | Disposition: A | Discharge status: Home / Self Care | Payer: Medicaid Other | Source: Ambulatory Visit | Attending: Family Medicine | Admitting: Family Medicine

## 2023-05-06 DIAGNOSIS — R52 Pain, unspecified: Secondary | ICD-10-CM

## 2023-05-08 NOTE — Progress Notes (Signed)
 Plastic and Reconstructive Surgery Clinic Note  Chief Complaint: No chief complaint on file.    Referred by: Self  History of Present Illness:  Subjective  Ms. Teresa Snyder is a 37 y.o. female who presents for bilateral hand numbness and tingling present for months and have progressively worsened over that time. The symptoms are worse with use of the hands and improved with rest. The symptoms wake the patient up at night occasionally. The patient denies inciting event or trauma. They have not undergone prior injections or NCS/EMG     Past Medical History:  No past medical history on file.  Problem List Patient Active Problem List  Diagnosis  . Numbness and tingling of hand    Past Surgical History: No past surgical history on file.  Social History: Social History   Tobacco Use  . Smoking status: Every Day    Current packs/day: 0.50    Types: Cigarettes  . Smokeless tobacco: Never  . Tobacco comments:    No e-cigarette or vape use patient reports  Substance Use Topics  . Alcohol use: Not on file    Family History: family history is not on file.  Allergies: No Known Allergies  Medications: has a current medication list which includes the following prescription(s): albuterol  hfa, bupropion , buspirone, calcium citrate-vitamin d3, famotidine , ipratropium-albuterol , levocetirizine, levothyroxine, lidocaine, montelukast , multivitamin, naproxen sodium, pantoprazole , solifenacin, spiriva  respimat, symbicort, zonisamide , and methylprednisolone .  Review of Systems: A 14 point review of systems was obtained and negative unless otherwise stated in the HPI  Physical Examination:  height is 1.676 m (5' 6) and weight is 97.5 kg (214 lb 14.4 oz). Her skin temperature is 97.4 F (36.3 C). Her blood pressure is 120/69 and her pulse is 77.  Body mass index is 34.69 kg/m.  Gen: well appearing female in no acute distress Psych: alert, normal mood/affect Neuro: follows commands, CN  VII function symmetric Eyes: pupils equal, round ENT:  mucous membranes moist and pink Resp: breathing comfortably on room air, non-labored, no distress CV: no clubbing, no cyanosis Skin: warm, no systemic rashes Extremity: Phalens/Durkens/Tinels positive     Assessment and Plan:  Problem List Items Addressed This Visit     Numbness and tingling of hand - Primary   Relevant Medications   methylPREDNISolone  (MEDROL  DOSEPAK) 4 mg 6 day dose pack   Other Relevant Orders   NCV/EMG/Ultrasound    37 y.o. female with bilateral hand numbness and tingling -We reviewed the relevant anatomy and treatment options, including night bracing (with wrist braces and Heelbo elbow pads worn in reverse), activity modifications, NSAID's, steroid injections and carpal tunnel and/or cubital tunnel release.  Given the history and physical exam findings, I have recommended electrodiagnostic studies to further delineate the location/severity of compression with possible decompression based on those results. The patient will follow up after completion of electrodiagnostic testing to review the results and formulate a plan   Teresa JULIANNA Hock, MD  Plastic & Reconstructive Surgery Hand & Upper Extremity Surgery [facility] 05/08/2023 4:19 PM

## 2023-06-04 ENCOUNTER — Other Ambulatory Visit: Payer: Self-pay | Admitting: Pulmonary Disease

## 2023-08-21 ENCOUNTER — Other Ambulatory Visit: Payer: Self-pay | Admitting: Pulmonary Disease

## 2023-10-02 NOTE — Progress Notes (Signed)
 Subjective Patient ID: Teresa Snyder is a 38 y.o. female.  Patient here for evaluation of a rash that started yesterday morning.  Patient reports the day before she felt itchy but no rash yet.  Patient reports history of multiple allergies and history of hives but states this rash appears a little different than past hives.  Patient takes singulair , Xyzal, and pepcid  daily.  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  She did visit friends over the weekend that has multiple dogs.  Patient does have an Epi Pen, but has not had any trouble breathing or facial swelling.  She does report she is getting over a cold, had cough and runny nose last week.     History provided by:  Patient Language interpreter used: No   Rash Associated symptoms include coughing and rhinorrhea. Pertinent negatives include no congestion, diarrhea, fatigue, fever, shortness of breath, sore throat or vomiting.    Review of Systems  Constitutional:  Negative for chills, diaphoresis, fatigue and fever.  HENT:  Positive for rhinorrhea. Negative for congestion, ear discharge, ear pain, facial swelling, sore throat and trouble swallowing.   Eyes:  Negative for discharge.  Respiratory:  Positive for cough. Negative for shortness of breath and wheezing.   Cardiovascular:  Negative for chest pain.  Gastrointestinal:  Negative for diarrhea, nausea and vomiting.  Musculoskeletal:  Negative for arthralgias and myalgias.  Skin:  Positive for rash.  Neurological:  Negative for dizziness and headaches.    Patient History  Allergies: No Known Allergies   History reviewed. No pertinent past medical history. History reviewed. No pertinent surgical history. Social History   Socioeconomic History  . Marital status: Divorced    Spouse name: Not on file  . Number of children: Not on file  . Years of education: Not on file  . Highest education level: Not on file  Occupational History  . Not  on file  Tobacco Use  . Smoking status: Every Day    Types: Cigarettes  . Smokeless tobacco: Current  Substance and Sexual Activity  . Alcohol use: Not on file  . Drug use: Not on file  . Sexual activity: Not on file  Other Topics Concern  . Not on file  Social History Narrative  . Not on file   History reviewed. No pertinent family history. Current Outpatient Medications on File Prior to Visit  Medication Sig Dispense Refill  . busPIRone (Buspar) 10 MG tablet TAKE ONE TABLET BY MOUTH DAILY. AND IF NEEDED MAY TAKE ANOTHER TABLET AS NEEDED.    . ergocalciferol  (Vitamin D2) 1.25 MG (50000 UT) capsule Take 1 capsule by mouth 1 (one) time per week.    . levothyroxine (Synthroid, Levoxyl) 112 MCG tablet ONE PILL EACH AM ON AN EMPTY STOMACH THEN NO FOOD OR DRINK FOR 30 MIN AFTER TAKING THE MEDICATION    . Spiriva  Respimat 2.5 MCG/ACT aerosol solution Inhale 2 puffs.    . buPROPion  XL (Wellbutrin  XL) 300 MG 24 hr tablet Take 300 mg by mouth 1 (one) time each day in the morning. (Patient not taking: Reported on 10/02/2023)    . zonisamide  (Zonegran ) 25 MG capsule TAKE TWO CAPSULES  BY MOUTH ONCE DAILY (Patient not taking: Reported on 10/02/2023)     No current facility-administered medications on file prior to visit.     Objective  Vitals:   10/02/23 1457  BP: 119/82  BP Location: Right arm  Pulse: 73  Resp: 18  Temp: 36.9 C (98.5 F)  TempSrc: Oral  SpO2: 99%  Weight: 97.5 kg  Height: 5' 7  PainSc: 0-No pain  LMP date is Unknown       2019 lmp      No results found.  Physical Exam Vitals and nursing note reviewed.  Constitutional:      General: She is not in acute distress.    Appearance: Normal appearance. She is not ill-appearing, toxic-appearing or diaphoretic.  HENT:     Head: Normocephalic and atraumatic.     Nose: Nose normal.     Mouth/Throat:     Mouth: Mucous membranes are moist.     Pharynx: Oropharynx is clear.  Eyes:     Conjunctiva/sclera:  Conjunctivae normal.     Pupils: Pupils are equal, round, and reactive to light.  Cardiovascular:     Rate and Rhythm: Normal rate and regular rhythm.     Pulses: Normal pulses.     Heart sounds: Normal heart sounds.  Pulmonary:     Effort: Pulmonary effort is normal. No respiratory distress.     Breath sounds: Normal breath sounds. No wheezing.  Skin:    General: Skin is warm.     Findings: Rash present.     Comments: Erythematous patches and papules on the anterior forearms, upper legs, back of neck, and torso   Neurological:     Mental Status: She is alert.      No results found for this visit on 10/02/23.   Procedures MDM:     1 Acute, uncomplicated illness or injury     Explanation of Medical Decision Making and variances from expected care:  Patient stable, no acute distress.  Vitals normal.  Discussed allergic causes versus viral exanthem or dermatitis.  Prednisone  prescribed.  Follow up precautions given    Risk:: Moderate          Assessment/Plan Diagnoses and all orders for this visit:  Rash -     predniSONE  (Deltasone ) 20 MG tablet; Take 1 tab PO BID x 5 days, then 1 tab PO daily x 5 days      Disposition Status: Home  Progress note signed by Eva Rase, PA on 10/02/23 at  3:48 PM

## 2023-10-02 NOTE — Progress Notes (Signed)
 Rash all over body since yesterday.

## 2023-10-05 ENCOUNTER — Emergency Department (HOSPITAL_COMMUNITY)
Admission: EM | Admit: 2023-10-05 | Discharge: 2023-10-05 | Discharge status: Against Medical Advice | Payer: Self-pay | Attending: Emergency Medicine | Admitting: Emergency Medicine

## 2023-10-05 ENCOUNTER — Encounter (HOSPITAL_COMMUNITY): Payer: Self-pay

## 2023-10-05 ENCOUNTER — Other Ambulatory Visit: Payer: Self-pay

## 2023-10-05 DIAGNOSIS — R21 Rash and other nonspecific skin eruption: Secondary | ICD-10-CM | POA: Insufficient documentation

## 2023-10-05 DIAGNOSIS — Z5321 Procedure and treatment not carried out due to patient leaving prior to being seen by health care provider: Secondary | ICD-10-CM | POA: Insufficient documentation

## 2023-10-05 DIAGNOSIS — L509 Urticaria, unspecified: Secondary | ICD-10-CM | POA: Insufficient documentation

## 2023-10-05 MED ORDER — DIPHENHYDRAMINE HCL 50 MG/ML IJ SOLN: 50.0000 mg | Freq: Once | INTRAMUSCULAR | Status: DC

## 2023-10-05 MED ORDER — METHYLPREDNISOLONE SODIUM SUCC 125 MG IJ SOLR: 125.0000 mg | Freq: Once | INTRAMUSCULAR | Status: DC

## 2023-10-05 NOTE — ED Notes (Signed)
 Pt not answering to call for room

## 2023-10-05 NOTE — ED Triage Notes (Addendum)
 Seen Thursday for possible contact dermatitis at [facility]- sent home with prednisone- 10 day course. Rash which started on back has now spread to entire body Denies resp involvement. No one else in house has bumps. Dog recently bathed as a precaution. HX hives- many allergies- grass, trees, mold, rodents, roaches-many more.

## 2023-10-05 NOTE — ED Provider Triage Note (Signed)
 Emergency Medicine Provider Triage Evaluation Note  Teresa Snyder , a 38 y.o. female  was evaluated in triage.  Pt complains of rash.  Patient reports that she has had a rash for the past week has been taking Xyzal daily and Benadryl intermittently without improvement.  Went to urgent care the other day and started her on prednisone.  Has been taking 20 mg for 3 days without improvement.  Review of Systems  Positive: hives Negative: Shortness of breath, throat tightening, chest pain  Physical Exam  BP 134/67 (BP Location: Right Arm)   Pulse 79   Temp 98 F (36.7 C)   Resp 18   Ht 5\' 7"  (1.702 m)   Wt 98.4 kg   LMP 04/24/2015   SpO2 100%   BMI 33.99 kg/m  Gen:   Awake, no distress   Resp:  Normal effort  MSK:   Moves extremities without difficulty  Other:  Hives   Medical Decision Making  Medically screening exam initiated at 3:16 PM.  Appropriate orders placed.  Teresa Snyder was informed that the remainder of the evaluation will be completed by another provider, this initial triage assessment does not replace that evaluation, and the importance of remaining in the ED until their evaluation is complete.   Maxwell Marion, PA-C 10/05/23 1520

## 2023-10-05 NOTE — ED Triage Notes (Addendum)
 Pt c/o increasing painful rash x6 days.  Pt reports it started on her back and is now everywhere including her perineal area.  Pt denies new lotions, soaps, perfumes, etc.    Pt was seen by PCP for same and reports "the prednisone made it worse."  Pt reports "doing a move out cleaning x7 days ago and visiting someone she hasn't seen in over a year x6 days ago."

## 2023-10-06 ENCOUNTER — Emergency Department (HOSPITAL_BASED_OUTPATIENT_CLINIC_OR_DEPARTMENT_OTHER)
Admission: EM | Admit: 2023-10-06 | Discharge: 2023-10-06 | Disposition: A | Discharge status: Home / Self Care | Attending: Emergency Medicine | Admitting: Emergency Medicine

## 2023-10-06 DIAGNOSIS — R21 Rash and other nonspecific skin eruption: Secondary | ICD-10-CM

## 2023-10-06 MED ORDER — DIPHENHYDRAMINE HCL 25 MG PO CAPS: 25.0000 mg | ORAL_CAPSULE | Freq: Once | ORAL | Status: DC

## 2023-10-06 MED ORDER — HYDROCORTISONE 1 % EX CREA
TOPICAL_CREAM | CUTANEOUS | 0 refills | Status: DC
Start: 2023-10-06 — End: 2024-02-18

## 2023-10-06 NOTE — ED Provider Notes (Signed)
 [place] EMERGENCY DEPARTMENT AT [facility] Provider Note   CSN: [id number] Arrival date & time: 10/05/23  2259     History  No chief complaint on file.   Teresa Snyder is a 38 y.o. female.  HPI   38 year old female presenting to the emergency department with a chief complaint of rash.  The patient was seen at urgent care on 10/02/2023 for the same complaint.  She was subsequently discharged on oral prednisone and has been taking Benadryl at home without symptomatic relief.  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  No new detergents or other potential skin irritants that she can think of.  No new medications.  She denies any tick borne exposures.  She denies any involvement of the mucous membranes, no involvement of the palms or soles of the feet.  She denies any fevers.  She has been frustrated because the medications that she was prescribed from urgent care as well as the OTC remedies have not been improving her symptoms.  Symptoms been ongoing since 10/01/2023.  Home Medications Prior to Admission medications   Medication Sig Start Date End Date Taking? Authorizing Provider  hydrocortisone cream 1 % Apply to affected area 2 times daily 10/06/23  Yes Ernie Avena, MD  albuterol (PROVENTIL HFA;VENTOLIN HFA) 108 (90 BASE) MCG/ACT inhaler Inhale 2 puffs into the lungs every 6 (six) hours as needed. For shortness of breath or wheezing    [provider]  budesonide-formoterol (SYMBICORT) 80-4.5 MCG/ACT inhaler Inhale 2 puffs into the lungs as needed. 10/24/20   [provider]  buPROPion (WELLBUTRIN XL) 150 MG 24 hr tablet Take 150 mg by mouth daily.    [provider]  busPIRone (BUSPAR) 10 MG tablet 10 mg 2 (two) times daily.    [provider]  EPINEPHrine 0.3 mg/0.3 mL IJ SOAJ injection Inject 0.3 mg into the muscle as needed. 10/30/18   [provider]   famotidine (PEPCID) 20 MG tablet Take 20 mg by mouth daily. 11/30/19   [provider]  ipratropium-albuterol (DUONEB) 0.5-2.5 (3) MG/3ML SOLN Take 3 mLs by nebulization every 6 (six) hours as needed. 06/13/22   Hunsucker, Lesia Sago, MD  levocetirizine (XYZAL) 5 MG tablet Take 1 tablet by mouth every evening. 10/24/20   [provider]  levothyroxine (SYNTHROID, LEVOTHROID) 25 MCG tablet Take 112 mcg by mouth daily.    [provider]  lidocaine (LIDODERM) 5 % Place 2 patches onto the skin.    [provider]  montelukast (SINGULAIR) 10 MG tablet Take 1 tablet by mouth daily. 05/26/20   [provider]  naproxen sodium (ANAPROX) 220 MG tablet Take 440 mg by mouth 2 (two) times daily as needed. For headache    [provider]  pantoprazole (PROTONIX) 40 MG tablet Take 1 tablet by mouth every morning. 08/08/20   [provider]  predniSONE (STERAPRED UNI-PAK 21 TAB) 10 MG (21) TBPK tablet Take 1 tablet (10 mg total) by mouth daily. Day 1: take 6 tabs.  Day 2: 5 tabs  Day 3: 4 tabs  Day 4: 3 tabs  Day 5: 2 tabs  Day 6: 1 tab 05/13/15   West, Emily, PA-C  solifenacin (VESICARE) 5 MG tablet Take 1 tablet by mouth daily. 10/24/20   [provider]  SPIRIVA RESPIMAT 2.5 MCG/ACT AERS INHALE 2 PUFFS INTO THE LUNGS DAILY. 08/22/23   Hunsucker, Lesia Sago, MD  Allergies    Patient has no known allergies.    Review of Systems   Review of Systems  Skin:  Positive for rash.  All other systems reviewed and are negative.   Physical Exam Updated Vital Signs BP (!) 144/79 (BP Location: Right Arm)   Pulse 63   Temp 98.1 F (36.7 C)   Resp 20   LMP 04/24/2015   SpO2 98%  Physical Exam Vitals and nursing note reviewed.  Constitutional:      General: She is not in acute distress. HENT:     Head: Normocephalic and atraumatic.  Eyes:     Conjunctiva/sclera: Conjunctivae normal.     Pupils: Pupils are equal, round, and reactive to  light.  Cardiovascular:     Rate and Rhythm: Normal rate and regular rhythm.  Pulmonary:     Effort: Pulmonary effort is normal. No respiratory distress.  Abdominal:     General: There is no distension.     Tenderness: There is no guarding.  Genitourinary:    Comments: No evidence of mucous membrane vaginal involvement. Musculoskeletal:        General: No deformity or signs of injury.     Cervical back: Neck supple.  Skin:    Findings: Rash present. No lesion.     Comments: Maculopapular rash that spares the palms and soles, spares mucous membranes.  Neurological:     General: No focal deficit present.     Mental Status: She is alert. Mental status is at baseline.     ED Results / Procedures / Treatments   Labs (all labs ordered are listed, but only abnormal results are displayed) Labs Reviewed - No data to display  EKG None  Radiology No results found.  Procedures Procedures    Medications Ordered in ED Medications  diphenhydrAMINE (BENADRYL) capsule 25 mg (has no administration in time range)    ED Course/ Medical Decision Making/ A&P                                 Medical Decision Making   38 year old female presenting to the emergency department with a chief complaint of rash.  The patient was seen at urgent care on 10/02/2023 for the same complaint.  She was subsequently discharged on oral prednisone and has been taking Benadryl at home without symptomatic relief.  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  No new detergents or other potential skin irritants that she can think of.  No new medications.  She denies any tick borne exposures.  She denies any involvement of the mucous membranes, no involvement of the palms or soles of the feet.  She denies any fevers.  She has been frustrated because the medications that she was prescribed from urgent care as well as the OTC remedies have not been  improving her symptoms.  Symptoms been ongoing since 10/01/2023.  On arrival, the patient was vitally stable.  Exam revealed a maculopapular rash that spares the palms and soles. No evidence of mucous membrane oral or vaginal involvement.  Suspect likely contact or allergic dermatitis versus viral exanthem.  Differential Diagnoses: There are no red flag symptoms such as fever, mucosal involvement, hypotension, toxic appearance, severe pain, immunosuppression, or a concerning medication being used. There are no physical exam findings or symptoms on ROS concerning for erythema multiforme, SJS/TEN, Lyme disease, cellulitis,  necrotizing fasciitis, purupra fulminans, angioedema, anaphylaxis, meningococcemia, DRESS, or RMSF. I do not think that the patient is septic.  I believe the patient  is safe for discharge home. The patient feels safe with this plan. I prescribed Hydrocortisone cream. I recommended she continue antihistamines for symptomatic management. She agreed to followup with her PCP. A referral to a dermatologist was placed.    Final Clinical Impression(s) / ED Diagnoses Final diagnoses:  Maculopapular rash    Rx / DC Orders ED Discharge Orders          Ordered    Ambulatory referral to Dermatology        10/06/23 0451    hydrocortisone cream 1 %        10/06/23 0457              Ernie Avena, MD 10/06/23 [phone]

## 2023-10-06 NOTE — Discharge Instructions (Addendum)
 A topical corticosteroid has been prescribed.  Continue with over-the-counter antihistamines for symptomatic relief.  Follow-up with your primary care provider, the referral for follow-up with a dermatologist has been placed

## 2023-10-06 NOTE — ED Notes (Signed)
 Pt refused benadryl. States she has this at home

## 2023-10-06 NOTE — ED Notes (Signed)
 D/c instructions given at length. Work excuse given per pt request. Pt. Aware that hydrocortisone cream will be at [facility] pharmacy. States this is not her pharmacy. Offered to get this changed for her and she refused. Pt states that she has been at [facility] ED all day and not happy that all the MD ordered here was benadryl, which she has at home.

## 2024-02-17 ENCOUNTER — Ambulatory Visit (HOSPITAL_COMMUNITY)
Admission: EM | Admit: 2024-02-17 | Discharge: 2024-02-18 | Disposition: A | Discharge status: Home / Self Care | Attending: Psychiatry | Admitting: Psychiatry

## 2024-02-17 DIAGNOSIS — J441 Chronic obstructive pulmonary disease with (acute) exacerbation: Secondary | ICD-10-CM | POA: Insufficient documentation

## 2024-02-17 DIAGNOSIS — Z8616 Personal history of COVID-19: Secondary | ICD-10-CM | POA: Insufficient documentation

## 2024-02-17 DIAGNOSIS — F909 Attention-deficit hyperactivity disorder, unspecified type: Secondary | ICD-10-CM | POA: Insufficient documentation

## 2024-02-17 DIAGNOSIS — F3162 Bipolar disorder, current episode mixed, moderate: Secondary | ICD-10-CM

## 2024-02-17 DIAGNOSIS — F314 Bipolar disorder, current episode depressed, severe, without psychotic features: Secondary | ICD-10-CM | POA: Insufficient documentation

## 2024-02-17 DIAGNOSIS — K219 Gastro-esophageal reflux disease without esophagitis: Secondary | ICD-10-CM | POA: Insufficient documentation

## 2024-02-17 DIAGNOSIS — T7840XS Allergy, unspecified, sequela: Secondary | ICD-10-CM

## 2024-02-17 DIAGNOSIS — N3281 Overactive bladder: Secondary | ICD-10-CM | POA: Insufficient documentation

## 2024-02-17 DIAGNOSIS — F1290 Cannabis use, unspecified, uncomplicated: Secondary | ICD-10-CM | POA: Insufficient documentation

## 2024-02-17 DIAGNOSIS — J45909 Unspecified asthma, uncomplicated: Secondary | ICD-10-CM

## 2024-02-17 DIAGNOSIS — F1090 Alcohol use, unspecified, uncomplicated: Secondary | ICD-10-CM | POA: Insufficient documentation

## 2024-02-17 DIAGNOSIS — F1220 Cannabis dependence, uncomplicated: Secondary | ICD-10-CM

## 2024-02-17 LAB — POCT URINE DRUG SCREEN - MANUAL ENTRY (I-SCREEN)
POC Amphetamine UR: NOT DETECTED
POC Buprenorphine (BUP): NOT DETECTED
POC Cocaine UR: NOT DETECTED
POC Marijuana UR: POSITIVE — AB
POC Methadone UR: NOT DETECTED
POC Methamphetamine UR: NOT DETECTED
POC Morphine: NOT DETECTED
POC Oxazepam (BZO): NOT DETECTED
POC Oxycodone UR: NOT DETECTED
POC Secobarbital (BAR): NOT DETECTED

## 2024-02-17 LAB — COMPREHENSIVE METABOLIC PANEL WITH GFR
ALT: 19 U/L (ref 0–44)
AST: 24 U/L (ref 15–41)
Albumin: 3.9 g/dL (ref 3.5–5.0)
Alkaline Phosphatase: 56 U/L (ref 38–126)
Anion gap: 9 (ref 5–15)
BUN: 17 mg/dL (ref 6–20)
CO2: 26 mmol/L (ref 22–32)
Calcium: 9.2 mg/dL (ref 8.9–10.3)
Chloride: 106 mmol/L (ref 98–111)
Creatinine, Ser: 0.93 mg/dL (ref 0.44–1.00)
GFR, Estimated: >60 mL/min (ref >60)
Glucose, Bld: 87 mg/dL (ref 70–99)
Potassium: 4.3 mmol/L (ref 3.5–5.1)
Sodium: 141 mmol/L (ref 135–145)
Total Bilirubin: 0.8 mg/dL (ref 0.0–1.2)
Total Protein: 6.8 g/dL (ref 6.5–8.1)

## 2024-02-17 LAB — CBC WITH DIFFERENTIAL/PLATELET
Abs Immature Granulocytes: 0.01 K/uL (ref 0.00–0.07)
Basophils Absolute: 0.1 K/uL (ref 0.0–0.1)
Basophils Relative: 1 %
Eosinophils Absolute: 0.6 K/uL — ABNORMAL HIGH (ref 0.0–0.5)
Eosinophils Relative: 9 %
HCT: 43.6 % (ref 36.0–46.0)
Hemoglobin: 14.8 g/dL (ref 12.0–15.0)
Immature Granulocytes: 0 %
Lymphocytes Relative: 43 %
Lymphs Abs: 2.6 K/uL (ref 0.7–4.0)
MCH: 31.5 pg (ref 26.0–34.0)
MCHC: 33.9 g/dL (ref 30.0–36.0)
MCV: 92.8 fL (ref 80.0–100.0)
Monocytes Absolute: 0.5 K/uL (ref 0.1–1.0)
Monocytes Relative: 8 %
Neutro Abs: 2.4 K/uL (ref 1.7–7.7)
Neutrophils Relative %: 39 %
Platelets: 245 K/uL (ref 150–400)
RBC: 4.7 MIL/uL (ref 3.87–5.11)
RDW: 13.4 % (ref 11.5–15.5)
WBC: 6.1 K/uL (ref 4.0–10.5)
nRBC: 0 % (ref 0.0–0.2)

## 2024-02-17 LAB — LIPID PANEL
Cholesterol: 208 mg/dL — ABNORMAL HIGH (ref 0–200)
HDL: 73 mg/dL (ref >40)
LDL Cholesterol: 114 mg/dL — ABNORMAL HIGH (ref 0–99)
Total CHOL/HDL Ratio: 2.8 ratio
Triglycerides: 103 mg/dL (ref <150)
VLDL: 21 mg/dL (ref 0–40)

## 2024-02-17 LAB — ETHANOL: Alcohol, Ethyl (B): <15 mg/dL (ref <15)

## 2024-02-17 LAB — POC URINE PREG, ED: Preg Test, Ur: NEGATIVE

## 2024-02-17 LAB — VITAMIN D 25 HYDROXY (VIT D DEFICIENCY, FRACTURES): Vit D, 25-Hydroxy: 30.81 ng/mL (ref 30–100)

## 2024-02-17 LAB — TSH: TSH: 1.835 u[IU]/mL (ref 0.350–4.500)

## 2024-02-17 LAB — MAGNESIUM: Magnesium: 1.8 mg/dL (ref 1.7–2.4)

## 2024-02-17 LAB — HEMOGLOBIN A1C
Hgb A1c MFr Bld: 4.7 % — ABNORMAL LOW (ref 4.8–5.6)
Mean Plasma Glucose: 88.19 mg/dL

## 2024-02-17 MED ORDER — ZONISAMIDE 25 MG PO CAPS
25.0000 mg | ORAL_CAPSULE | Freq: Two times a day (BID) | ORAL | Status: DC
  Administered 2024-02-17 – 2024-02-18 (×2): 25 mg via ORAL
  Filled 2024-02-17 (×5): qty 1

## 2024-02-17 MED ORDER — ACETAMINOPHEN 325 MG PO TABS
650.0000 mg | ORAL_TABLET | Freq: Four times a day (QID) | ORAL | Status: DC | PRN
  Administered 2024-02-18: 650 mg via ORAL
  Filled 2024-02-17: qty 2

## 2024-02-17 MED ORDER — HALOPERIDOL LACTATE 5 MG/ML IJ SOLN: 10.0000 mg | Freq: Three times a day (TID) | INTRAMUSCULAR | Status: DC | PRN

## 2024-02-17 MED ORDER — MONTELUKAST SODIUM 10 MG PO TABS
10.0000 mg | ORAL_TABLET | Freq: Once | ORAL | Status: AC
  Administered 2024-02-17: 10 mg via ORAL
  Filled 2024-02-17: qty 1

## 2024-02-17 MED ORDER — HALOPERIDOL 5 MG PO TABS: 5.0000 mg | ORAL_TABLET | Freq: Three times a day (TID) | ORAL | Status: DC | PRN

## 2024-02-17 MED ORDER — LORAZEPAM 2 MG/ML IJ SOLN: 2.0000 mg | Freq: Three times a day (TID) | INTRAMUSCULAR | Status: DC | PRN

## 2024-02-17 MED ORDER — DIPHENHYDRAMINE HCL 50 MG/ML IJ SOLN: 50.0000 mg | Freq: Three times a day (TID) | INTRAMUSCULAR | Status: DC | PRN

## 2024-02-17 MED ORDER — NICOTINE 21 MG/24HR TD PT24
21.0000 mg | MEDICATED_PATCH | Freq: Every day | TRANSDERMAL | Status: DC
  Filled 2024-02-17: qty 1

## 2024-02-17 MED ORDER — MAGNESIUM HYDROXIDE 400 MG/5ML PO SUSP: 30.0000 mL | Freq: Every day | ORAL | Status: DC | PRN

## 2024-02-17 MED ORDER — HALOPERIDOL LACTATE 5 MG/ML IJ SOLN: 5.0000 mg | Freq: Three times a day (TID) | INTRAMUSCULAR | Status: DC | PRN

## 2024-02-17 MED ORDER — ALBUTEROL SULFATE HFA 108 (90 BASE) MCG/ACT IN AERS: 1.0000 | INHALATION_SPRAY | RESPIRATORY_TRACT | Status: DC | PRN

## 2024-02-17 MED ORDER — PANTOPRAZOLE SODIUM 40 MG PO TBEC
40.0000 mg | DELAYED_RELEASE_TABLET | Freq: Every day | ORAL | Status: DC
  Administered 2024-02-17 – 2024-02-18 (×2): 40 mg via ORAL
  Filled 2024-02-17 (×2): qty 1

## 2024-02-17 MED ORDER — TRAZODONE HCL 50 MG PO TABS
50.0000 mg | ORAL_TABLET | Freq: Every evening | ORAL | Status: DC | PRN
  Filled 2024-02-17: qty 1

## 2024-02-17 MED ORDER — ALUM & MAG HYDROXIDE-SIMETH 200-200-20 MG/5ML PO SUSP: 30.0000 mL | ORAL | Status: DC | PRN

## 2024-02-17 MED ORDER — BUPROPION HCL ER (XL) 150 MG PO TB24
150.0000 mg | ORAL_TABLET | Freq: Once | ORAL | Status: AC
  Administered 2024-02-18: 150 mg via ORAL
  Filled 2024-02-17: qty 1

## 2024-02-17 MED ORDER — HYDROXYZINE HCL 25 MG PO TABS: 25.0000 mg | ORAL_TABLET | Freq: Three times a day (TID) | ORAL | Status: DC | PRN

## 2024-02-17 MED ORDER — DIPHENHYDRAMINE HCL 50 MG PO CAPS: 50.0000 mg | ORAL_CAPSULE | Freq: Three times a day (TID) | ORAL | Status: DC | PRN
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
## 2024-02-17 NOTE — ED Notes (Signed)
 Pt has been calm, cooperative, said she came in because I was off my meds for  a while due to insurance and it pushed me into a manic episode. I have been feeling a little off for about a month. Pt denied SI/HI/AVH. Pt said she is hoping to get help so she can be back on her meds. Pt's belongings have been secured per hospital protocol. She has been encouraged to make her needs known to staff. Staff will monitor her for safety.
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
## 2024-02-17 NOTE — ED Notes (Signed)
 Patient observed/assessed in bed/chair resting quietly appearing in no distress and verbalizing no complaints at this time. Will continue to monitor.

## 2024-02-18 ENCOUNTER — Other Ambulatory Visit: Payer: Self-pay

## 2024-02-18 NOTE — ED Provider Notes (Signed)
 [facility]BC/OBS ASAP Discharge Summary  Date and Time: 02/18/2024 1:22 PM  Name: Teresa Snyder  MRN:  [id number]   Discharge Diagnoses:  Final diagnoses:  Bipolar 1 disorder, mixed, moderate (HCC)  Gastroesophageal reflux disease without esophagitis  Mild asthma without complication, unspecified whether persistent  COPD exacerbation (HCC)  Overactive bladder  Allergy, sequela  Attention deficit hyperactivity disorder (ADHD), unspecified ADHD type  Moderate tetrahydrocannabinol (THC) dependence (HCC)    Subjective:   Patient reports having presented to [facility] for medication management and manic symptoms. She reports that since losing her job and insurance in late May, she has rationed her mood stabilizer (Zonegran ), stopped it altogether, and abruptly restarted it. While she maintains that she is on her standard doses of medications now, she believes that the period of rationing has elicited manic symptoms.She shares that her current symptoms are similar to her prior manic episodes: sleep disturbance (elevated mood, goal-directed behaviors ex. Cleaning, sleeping for 30 hours consecuatively three weeks ago vs. no sleep yesterday and the day prior, grandiose business ideas). She also reports depressive symptoms (low mood) over the past 2 weeks.    Overnight, patient reports sleeping on and off for facility-related reasons. She endorses seeing and feeling small, blond, gnat-like bugs and shows interviewers where she felt itchiness. She reports showing nursing staff evidence of the bugs.   Patient endorses non-specific somatic symptoms (headache, backache, irritability), which she attributes to peri-menopause. She shares she uses Aleve at home to relieve these symptoms.  Previously on Vraylar and Abilify, neither of which patient was able to tolerate. Amenable to short inpatient stay however son's birthday is on [birth year].    Stay Summary: [facility]  for mixed bipolar presentation. Agreed to start PHP next week. Has appointment set up for 7/29. No acute concerns at this time. Denies suicidal and homicidal ideation. If patient requires an earlier med management appointment, can come in early AM at [facility] for walk-in upstairs. Instructions given. No AVH were elicited at discharge. Told to call [phone] if in crisis. Of note, zonisamide  (zonegran ), while an anti-epileptic, is not an FDA-approved medication for bipolar I disorder.   Total Time spent with patient: 30 minutes  Past Psychiatric History: No current therapy, psychiatrist. No previous hospitalizations. Diagnosis of Bipolar I disorder, although presentation is somewhat atypical. Denies 4+ days of elevated/expansive mood. Would explore alternative diagnoses in outpatient setting.  Past Medical History: None pertinent Family History: None known Social History: Endorses regular THC use.   Current Medications:  Current Facility-Administered Medications  Medication Dose Route Frequency Provider Last Rate Last Admin   acetaminophen  (TYLENOL ) tablet 650 mg  650 mg Oral Q6H PRN Olasunkanmi, Oluwatosin, NP   650 mg at 02/18/24 1009   albuterol  (VENTOLIN  HFA) 108 (90 Base) MCG/ACT inhaler 1-2 puff  1-2 puff Inhalation Q4H PRN Olasunkanmi, Oluwatosin, NP       alum & mag hydroxide-simeth (MAALOX/MYLANTA) 200-200-20 MG/5ML suspension 30 mL  30 mL Oral Q4H PRN Olasunkanmi, Oluwatosin, NP       haloperidol  (HALDOL ) tablet 5 mg  5 mg Oral TID PRN Olasunkanmi, Oluwatosin, NP       And   diphenhydrAMINE  (BENADRYL ) capsule 50 mg  50 mg Oral TID PRN Olasunkanmi, Oluwatosin, NP       haloperidol  lactate (HALDOL ) injection 5 mg  5 mg Intramuscular TID PRN Olasunkanmi, Oluwatosin, NP       And   diphenhydrAMINE  (BENADRYL ) injection 50 mg  50 mg Intramuscular TID PRN  Olasunkanmi, Oluwatosin, NP       And   LORazepam  (ATIVAN ) injection 2 mg  2 mg Intramuscular TID PRN Olasunkanmi, Oluwatosin, NP       haloperidol   lactate (HALDOL ) injection 10 mg  10 mg Intramuscular TID PRN Olasunkanmi, Oluwatosin, NP       And   diphenhydrAMINE  (BENADRYL ) injection 50 mg  50 mg Intramuscular TID PRN Olasunkanmi, Oluwatosin, NP       And   LORazepam  (ATIVAN ) injection 2 mg  2 mg Intramuscular TID PRN Olasunkanmi, Oluwatosin, NP       hydrOXYzine  (ATARAX ) tablet 25 mg  25 mg Oral TID PRN Olasunkanmi, Oluwatosin, NP       magnesium  hydroxide (MILK OF MAGNESIA) suspension 30 mL  30 mL Oral Daily PRN Olasunkanmi, Oluwatosin, NP       nicotine  (NICODERM CQ  - dosed in mg/24 hours) patch 21 mg  21 mg Transdermal Q0600 Olasunkanmi, Oluwatosin, NP       pantoprazole  (PROTONIX ) EC tablet 40 mg  40 mg Oral Daily Olasunkanmi, Oluwatosin, NP   40 mg at 02/18/24 1006   traZODone  (DESYREL ) tablet 50 mg  50 mg Oral QHS PRN Olasunkanmi, Oluwatosin, NP       zonisamide  (ZONEGRAN ) capsule 25 mg  25 mg Oral BID Olasunkanmi, Oluwatosin, NP   25 mg at 02/18/24 1050   Current Outpatient Medications  Medication Sig Dispense Refill   albuterol  (PROVENTIL  HFA;VENTOLIN  HFA) 108 (90 BASE) MCG/ACT inhaler Inhale 2 puffs into the lungs every 6 (six) hours as needed for wheezing or shortness of breath. For shortness of breath or wheezing     buPROPion  (WELLBUTRIN  XL) 150 MG 24 hr tablet Take 150 mg by mouth daily.     EPINEPHrine 0.3 mg/0.3 mL IJ SOAJ injection Inject 0.3 mg into the muscle as needed for anaphylaxis.     levocetirizine (XYZAL) 5 MG tablet Take 5 mg by mouth daily.     montelukast  (SINGULAIR ) 10 MG tablet Take 10 mg by mouth daily.     Multiple Vitamin (MULTIVITAMIN WITH MINERALS) TABS tablet Take 2 tablets by mouth daily.     omeprazole (PRILOSEC) 20 MG capsule Take 20 mg by mouth daily.     OVER THE COUNTER MEDICATION Take 2 tablets by mouth daily. Calcium Gummies     SPIRIVA  RESPIMAT 2.5 MCG/ACT AERS INHALE 2 PUFFS INTO THE LUNGS DAILY. 4 g 0   SYMBICORT 160-4.5 MCG/ACT inhaler Inhale 2 puffs into the lungs 2 (two) times daily.      zonisamide  (ZONEGRAN ) 25 MG capsule Take 25 mg by mouth 2 (two) times daily.      PTA Medications:  PTA Medications  Medication Sig   albuterol  (PROVENTIL  HFA;VENTOLIN  HFA) 108 (90 BASE) MCG/ACT inhaler Inhale 2 puffs into the lungs every 6 (six) hours as needed for wheezing or shortness of breath. For shortness of breath or wheezing   montelukast  (SINGULAIR ) 10 MG tablet Take 10 mg by mouth daily.   levocetirizine (XYZAL) 5 MG tablet Take 5 mg by mouth daily.   EPINEPHrine 0.3 mg/0.3 mL IJ SOAJ injection Inject 0.3 mg into the muscle as needed for anaphylaxis.   buPROPion  (WELLBUTRIN  XL) 150 MG 24 hr tablet Take 150 mg by mouth daily.   SPIRIVA  RESPIMAT 2.5 MCG/ACT AERS INHALE 2 PUFFS INTO THE LUNGS DAILY.   zonisamide  (ZONEGRAN ) 25 MG capsule Take 25 mg by mouth 2 (two) times daily.   omeprazole (PRILOSEC) 20 MG capsule Take 20 mg by mouth daily.  SYMBICORT 160-4.5 MCG/ACT inhaler Inhale 2 puffs into the lungs 2 (two) times daily.   Multiple Vitamin (MULTIVITAMIN WITH MINERALS) TABS tablet Take 2 tablets by mouth daily.   OVER THE COUNTER MEDICATION Take 2 tablets by mouth daily. Calcium Gummies   Facility Ordered Medications  Medication   acetaminophen  (TYLENOL ) tablet 650 mg   alum & mag hydroxide-simeth (MAALOX/MYLANTA) 200-200-20 MG/5ML suspension 30 mL   magnesium  hydroxide (MILK OF MAGNESIA) suspension 30 mL   haloperidol  (HALDOL ) tablet 5 mg   And   diphenhydrAMINE  (BENADRYL ) capsule 50 mg   haloperidol  lactate (HALDOL ) injection 5 mg   And   diphenhydrAMINE  (BENADRYL ) injection 50 mg   And   LORazepam  (ATIVAN ) injection 2 mg   haloperidol  lactate (HALDOL ) injection 10 mg   And   diphenhydrAMINE  (BENADRYL ) injection 50 mg   And   LORazepam  (ATIVAN ) injection 2 mg   traZODone  (DESYREL ) tablet 50 mg   hydrOXYzine  (ATARAX ) tablet 25 mg   nicotine  (NICODERM CQ  - dosed in mg/24 hours) patch 21 mg   [COMPLETED] montelukast  (SINGULAIR ) tablet 10 mg   albuterol  (VENTOLIN   HFA) 108 (90 Base) MCG/ACT inhaler 1-2 puff   zonisamide  (ZONEGRAN ) capsule 25 mg   pantoprazole  (PROTONIX ) EC tablet 40 mg   [COMPLETED] buPROPion  (WELLBUTRIN  XL) 24 hr tablet 150 mg        No data to display          Flowsheet Row ED from 02/17/2024 in [facility] ED from 10/06/2023 in [facility] Emergency Department at [facility] ED from 10/05/2023 in [facility] Emergency Department at [facility]  C-SSRS RISK CATEGORY No Risk No Risk No Risk    Musculoskeletal  Strength & Muscle Tone: within normal limits Gait & Station: normal Patient leans: N/A  Psychiatric Specialty Exam  Presentation  General Appearance:  Appropriate for Environment  Eye Contact: Good  Speech: Clear and Coherent  Speech Volume: Normal  Handedness: Right   Mood and Affect  Mood: Anxious; Depressed  Affect: Congruent; Tearful   Thought Process  Thought Processes: Coherent; Goal Directed  Descriptions of Associations:Intact  Orientation:Full (Time, Place and Person)  Thought Content:Logical  Diagnosis of Schizophrenia or Schizoaffective disorder in past: No    Hallucinations:Hallucinations: None  Ideas of Reference:None  Suicidal Thoughts:Suicidal Thoughts: No  Homicidal Thoughts:Homicidal Thoughts: No   Sensorium  Memory: Immediate Good; Remote Good  Judgment: Good  Insight: Fair   Art therapist  Concentration: Fair  Attention Span: FairMERL Richard  Recall: Good  Fund of Knowledge: Good  Language: Good   Psychomotor Activity  Psychomotor Activity: Psychomotor Activity: Normal   Assets  Assets: Communication Skills; Desire for Improvement; Talents/Skills   Sleep  Sleep: Sleep: Poor  No Safety Checks orders active in given range  Nutritional Assessment (For OBS and [facility] admissions only) Has the patient had a weight loss or gain of 10 pounds or more in the last 3 months?: No Has the patient  had a decrease in food intake/or appetite?: Yes Does the patient have dental problems?: No Does the patient have eating habits or behaviors that may be indicators of an eating disorder including binging or inducing vomiting?: Yes (Lost 240lbs between 2017 and 2023. Reports purging after binge) Has the patient recently lost weight without trying?: 0 Has the patient been eating poorly because of a decreased appetite?: 1 Malnutrition Screening Tool Score: 1    Physical Exam  Physical Exam ROS Blood pressure (!) 112/56, pulse 64, temperature  98.2 F (36.8 C), temperature source Oral, resp. rate 16, last menstrual period 04/24/2015, SpO2 100%. There is no height or weight on file to calculate BMI.  Demographic Factors:  Unemployed and NA  Loss Factors: Decrease in vocational status  Historical Factors: NA  Risk Reduction Factors:   Responsible for children under 73 years of age, Sense of responsibility to family, Living with another person, especially a relative, and Positive social support  Continued Clinical Symptoms:  Severe Anxiety and/or Agitation Bipolar Disorder:   Mixed State Alcohol/Substance Abuse/Dependencies  Cognitive Features That Contribute To Risk:  None    Suicide Risk:  Minimal: No identifiable suicidal ideation.  Patients presenting with no risk factors; may be classified as minimal risk based on the severity of the depressive symptoms  Plan Of Care/Follow-up recommendations:  Follow-up recommendations:  Activity:  Normal, as tolerated Diet:  Per PCP recommendation  Patient is instructed prior to discharge to: Take all medications as prescribed by her mental healthcare provider. Report any adverse effects and/or reactions from the medicines to her outpatient provider promptly. Patient has been instructed & cautioned: To not engage in alcohol and or illegal drug use while on prescription medicines.  In the event of worsening symptoms, patient is instructed to  call the crisis hotline at [phone], 911 and or go to the nearest ED for appropriate evaluation and treatment of symptoms. To follow-up with her primary care provider for your other medical issues, concerns and or health care needs.   Disposition: Home  Wilmar Prabhakar, MD 02/18/2024, 1:22 PM

## 2024-02-18 NOTE — ED Provider Notes (Incomplete)
 Behavioral Health Progress Note  Date and Time: 02/18/2024 8:25 AM Name: Teresa Snyder MRN:  [id number]  Subjective:  Patient reports having presented to [facility] for medication management and manic symptoms. She reports that since losing her job and insurance, she has rationed her mood stabilizer (Zonigram), stopped it altogether, and abruptly restarted it. She shares that her current symptoms are similar to her prior manic episodes: sleep disturbance (elevated mood, goal-directed behaviors ex. Cleaning, sleeping 30 hours one day 2 weeks ago vs. Not sleeping at all yesterday and the day prior, grandiose business ideas). She also reports depressive symptoms (low mood) over the past 2 weeks.  Overnight, patient reports sleeping on and off for facility-related reasons. She endorses seeing and feeling small, blond, gnat-like bugs and shows interviewers where she felt itchiness. She reports showing nursing staff evidence of the bugs.  Patient endorses non-specific somatic symptoms (headache, backache, irritability), which she attributes to peri-menopause. She shares she uses Aleve at home to relieve these symptoms.  Diagnosis:  Final diagnoses:  Bipolar 1 disorder, mixed, moderate (HCC)  Gastroesophageal reflux disease without esophagitis  Mild asthma without complication, unspecified whether persistent  COPD exacerbation (HCC)  Overactive bladder  Allergy, sequela  Attention deficit hyperactivity disorder (ADHD), unspecified ADHD type  Moderate tetrahydrocannabinol (THC) dependence (HCC)    Total Time spent with patient: 15 minutes  Past Psychiatric History: Bipolar Disorder (I vs. II?)*** on Zonegran , GAD, MDD ADHD, THC use Past Medical History: Obesity s/p sleeve gastrectomy in 2017, Asthma, COPD, thyroid disease (unspecified) formerly on levothyroxine Family Psychiatric History: sisters with diagnosis of bipolar Social History: Lost job and insurance in May of 2025. Son turning 18  this Friday (July 25).  Additional Social History:    Pain Medications: See MAR Prescriptions: See MAR Over the Counter: See MAR History of alcohol / drug use?: Yes Longest period of sobriety (when/how long): N/A Withdrawal Symptoms: None Name of Substance 1: THC 1 - Age of First Use: unknown 1 - Amount (size/oz): 3-4 grams over a week 1 - Frequency: daily 1 - Duration: unknown 1 - Last Use / Amount: this am a lot to help her sleep 1 - Method of Aquiring: buys 1- Route of Use: smokes  Sleep: Fair  Appetite:  Fair  Current Medications:  Current Facility-Administered Medications  Medication Dose Route Frequency Provider Last Rate Last Admin   acetaminophen  (TYLENOL ) tablet 650 mg  650 mg Oral Q6H PRN Olasunkanmi, Oluwatosin, NP       albuterol  (VENTOLIN  HFA) 108 (90 Base) MCG/ACT inhaler 1-2 puff  1-2 puff Inhalation Q4H PRN Olasunkanmi, Oluwatosin, NP       alum & mag hydroxide-simeth (MAALOX/MYLANTA) 200-200-20 MG/5ML suspension 30 mL  30 mL Oral Q4H PRN Olasunkanmi, Oluwatosin, NP       buPROPion  (WELLBUTRIN  XL) 24 hr tablet 150 mg  150 mg Oral Once Olasunkanmi, Oluwatosin, NP       haloperidol  (HALDOL ) tablet 5 mg  5 mg Oral TID PRN Olasunkanmi, Oluwatosin, NP       And   diphenhydrAMINE  (BENADRYL ) capsule 50 mg  50 mg Oral TID PRN Olasunkanmi, Oluwatosin, NP       haloperidol  lactate (HALDOL ) injection 5 mg  5 mg Intramuscular TID PRN Olasunkanmi, Oluwatosin, NP       And   diphenhydrAMINE  (BENADRYL ) injection 50 mg  50 mg Intramuscular TID PRN Olasunkanmi, Oluwatosin, NP       And   LORazepam  (ATIVAN ) injection 2 mg  2 mg Intramuscular TID PRN Olasunkanmi,  Oluwatosin, NP       haloperidol  lactate (HALDOL ) injection 10 mg  10 mg Intramuscular TID PRN Olasunkanmi, Oluwatosin, NP       And   diphenhydrAMINE  (BENADRYL ) injection 50 mg  50 mg Intramuscular TID PRN Olasunkanmi, Oluwatosin, NP       And   LORazepam  (ATIVAN ) injection 2 mg  2 mg Intramuscular TID PRN  Olasunkanmi, Oluwatosin, NP       hydrOXYzine  (ATARAX ) tablet 25 mg  25 mg Oral TID PRN Olasunkanmi, Oluwatosin, NP       magnesium  hydroxide (MILK OF MAGNESIA) suspension 30 mL  30 mL Oral Daily PRN Olasunkanmi, Oluwatosin, NP       nicotine  (NICODERM CQ  - dosed in mg/24 hours) patch 21 mg  21 mg Transdermal Q0600 Olasunkanmi, Oluwatosin, NP       pantoprazole  (PROTONIX ) EC tablet 40 mg  40 mg Oral Daily Olasunkanmi, Oluwatosin, NP   40 mg at 02/17/24 2008   traZODone  (DESYREL ) tablet 50 mg  50 mg Oral QHS PRN Olasunkanmi, Oluwatosin, NP       zonisamide  (ZONEGRAN ) capsule 25 mg  25 mg Oral BID Olasunkanmi, Oluwatosin, NP   25 mg at 02/17/24 2258   Current Outpatient Medications  Medication Sig Dispense Refill   omeprazole (PRILOSEC) 20 MG capsule Take 20 mg by mouth daily.     albuterol  (PROVENTIL  HFA;VENTOLIN  HFA) 108 (90 BASE) MCG/ACT inhaler Inhale 2 puffs into the lungs every 6 (six) hours as needed. For shortness of breath or wheezing     buPROPion  (WELLBUTRIN  XL) 150 MG 24 hr tablet Take 150 mg by mouth daily.     EPINEPHrine 0.3 mg/0.3 mL IJ SOAJ injection Inject 0.3 mg into the muscle as needed.     hydrocortisone  cream 1 % Apply to affected area 2 times daily 15 g 0   ipratropium-albuterol  (DUONEB) 0.5-2.5 (3) MG/3ML SOLN Take 3 mLs by nebulization every 6 (six) hours as needed. 360 mL 11   levocetirizine (XYZAL) 5 MG tablet Take 1 tablet by mouth every evening.     montelukast  (SINGULAIR ) 10 MG tablet Take 1 tablet by mouth daily.     SPIRIVA  RESPIMAT 2.5 MCG/ACT AERS INHALE 2 PUFFS INTO THE LUNGS DAILY. 4 g 0   SYMBICORT 160-4.5 MCG/ACT inhaler Inhale 2 puffs into the lungs 2 (two) times daily.     zonisamide  (ZONEGRAN ) 25 MG capsule Take 25 mg by mouth 2 (two) times daily.      Labs  Lab Results:  Admission on 02/17/2024  Component Date Value Ref Range Status   WBC 02/17/2024 6.1  4.0 - 10.5 K/uL Final   RBC 02/17/2024 4.70  3.87 - 5.11 MIL/uL Final   Hemoglobin 02/17/2024  14.8  12.0 - 15.0 g/dL Final   HCT 92/77/7974 43.6  36.0 - 46.0 % Final   MCV 02/17/2024 92.8  80.0 - 100.0 fL Final   MCH 02/17/2024 31.5  26.0 - 34.0 pg Final   MCHC 02/17/2024 33.9  30.0 - 36.0 g/dL Final   RDW 92/77/7974 13.4  11.5 - 15.5 % Final   Platelets 02/17/2024 245  150 - 400 K/uL Final   nRBC 02/17/2024 0.0  0.0 - 0.2 % Final   Neutrophils Relative % 02/17/2024 39  % Final   Neutro Abs 02/17/2024 2.4  1.7 - 7.7 K/uL Final   Lymphocytes Relative 02/17/2024 43  % Final   Lymphs Abs 02/17/2024 2.6  0.7 - 4.0 K/uL Final   Monocytes Relative 02/17/2024 8  %  Final   Monocytes Absolute 02/17/2024 0.5  0.1 - 1.0 K/uL Final   Eosinophils Relative 02/17/2024 9  % Final   Eosinophils Absolute 02/17/2024 0.6 (H)  0.0 - 0.5 K/uL Final   Basophils Relative 02/17/2024 1  % Final   Basophils Absolute 02/17/2024 0.1  0.0 - 0.1 K/uL Final   Immature Granulocytes 02/17/2024 0  % Final   Abs Immature Granulocytes 02/17/2024 0.01  0.00 - 0.07 K/uL Final   Performed at [facility], [address] [place], [place] [address]   Sodium 02/17/2024 141  135 - 145 mmol/L Final   Potassium 02/17/2024 4.3  3.5 - 5.1 mmol/L Final   Chloride 02/17/2024 106  98 - 111 mmol/L Final   CO2 02/17/2024 26  22 - 32 mmol/L Final   Glucose, Bld 02/17/2024 87  70 - 99 mg/dL Final   Glucose reference range applies only to samples taken after fasting for at least 8 hours.   BUN 02/17/2024 17  6 - 20 mg/dL Final   Creatinine, Ser 02/17/2024 0.93  0.44 - 1.00 mg/dL Final   Calcium 92/77/7974 9.2  8.9 - 10.3 mg/dL Final   Total Protein 92/77/7974 6.8  6.5 - 8.1 g/dL Final   Albumin 92/77/7974 3.9  3.5 - 5.0 g/dL Final   AST 92/77/7974 24  15 - 41 U/L Final   ALT 02/17/2024 19  0 - 44 U/L Final   Alkaline Phosphatase 02/17/2024 56  38 - 126 U/L Final   Total Bilirubin 02/17/2024 0.8  0.0 - 1.2 mg/dL Final   GFR, Estimated 02/17/2024 >60  >60 mL/min Final   Comment: (NOTE) Calculated using the CKD-EPI  Creatinine Equation (2021)    Anion gap 02/17/2024 9  5 - 15 Final   Performed at [facility], [address] [place], [place] [address]   Hgb A1c MFr Bld 02/17/2024 4.7 (L)  4.8 - 5.6 % Final   Comment: (NOTE) Diagnosis of Diabetes The following HbA1c ranges recommended by the American Diabetes Association (ADA) may be used as an aid in the diagnosis of diabetes mellitus.  Hemoglobin             Suggested A1C NGSP%              Diagnosis  <5.7                   Non Diabetic  5.7-6.4                Pre-Diabetic  >6.4                   Diabetic  <7.0                   Glycemic control for                       adults with diabetes.     Mean Plasma Glucose 02/17/2024 88.19  mg/dL Final   Performed at [facility], [address], [place], [place] [address]   Magnesium  02/17/2024 1.8  1.7 - 2.4 mg/dL Final   Performed at [facility], [address] [place], [place] [address]   Alcohol, Ethyl (B) 02/17/2024 <15  <15 mg/dL Final   Comment: (NOTE) For medical purposes only. Performed at [facility], [address], [place], [place] [address]    Cholesterol 02/17/2024 208 (H)  0 - 200 mg/dL  Final   Triglycerides 02/17/2024 103  <150 mg/dL Final   HDL 92/77/7974 73  >40 mg/dL Final   Total CHOL/HDL Ratio 02/17/2024 2.8  RATIO Final   VLDL 02/17/2024 21  0 - 40 mg/dL Final   LDL Cholesterol 02/17/2024 114 (H)  0 - 99 mg/dL Final   Comment:        Total Cholesterol/HDL:CHD Risk Coronary Heart Disease Risk Table                     Men   Women  1/2 Average Risk   3.4   3.3  Average Risk       5.0   4.4  2 X Average Risk   9.6   7.1  3 X Average Risk  23.4   11.0        Use the calculated Patient Ratio above and the CHD Risk Table to determine the patient's CHD Risk.        ATP III CLASSIFICATION (LDL):  <100     mg/dL   Optimal  899-870  mg/dL   Near or Above                    Optimal  130-159  mg/dL   Borderline  839-810  mg/dL   High   >809     mg/dL   Very High Performed at [facility], [address], [place], [place] [address]    TSH 02/17/2024 1.835  0.350 - 4.500 uIU/mL Final   Comment: Performed by a 3rd Generation assay with a functional sensitivity of <=0.01 uIU/mL. Performed at [facility], [address] [place], [place] [address]    Vit D, 25-Hydroxy 02/17/2024 30.81  30 - 100 ng/mL Final   Comment: (NOTE) Vitamin D  deficiency has been defined by the Institute of Medicine  and an Endocrine Society practice guideline as a level of serum 25-OH  vitamin D  less than 20 ng/mL (1,2). The Endocrine Society went on to  further define vitamin D  insufficiency as a level between 21 and 29  ng/mL (2).  1. IOM (Institute of Medicine). 2010. Dietary reference intakes for  calcium and D. [place]: [employer]. 2. Holick MF, Binkley Lakin, Bischoff-Ferrari HA, et al. Evaluation,  treatment, and prevention of vitamin D  deficiency: an Endocrine  Society clinical practice guideline, JCEM. 2011 Jul; 96(7): 1911-30.  Performed at [facility], [address] [place], [place] [address]    POC Amphetamine UR 02/17/2024 None Detected  NONE DETECTED (Cut Off Level 1000 ng/mL) Final   POC Secobarbital (BAR) 02/17/2024 None Detected  NONE DETECTED (Cut Off Level 300 ng/mL) Final   POC Buprenorphine (BUP) 02/17/2024 None Detected  NONE DETECTED (Cut Off Level 10 ng/mL) Final   POC Oxazepam (BZO) 02/17/2024 None Detected  NONE DETECTED (Cut Off Level 300 ng/mL) Final   POC Cocaine UR 02/17/2024 None Detected  NONE DETECTED (Cut Off Level 300 ng/mL) Final   POC Methamphetamine UR 02/17/2024 None Detected  NONE DETECTED (Cut Off Level 1000 ng/mL) Final   POC Morphine 02/17/2024 None Detected  NONE DETECTED (Cut Off Level 300 ng/mL) Final   POC Methadone UR 02/17/2024 None Detected  NONE DETECTED (Cut Off Level 300 ng/mL) Final   POC Oxycodone UR 02/17/2024 None Detected  NONE DETECTED (Cut Off  Level 100 ng/mL) Final   POC Marijuana UR 02/17/2024 Positive (A)  NONE DETECTED (Cut Off Level 50 ng/mL)  Final   Preg Test, Ur 02/17/2024 Negative  Negative Final    Blood Alcohol level:  Lab Results  Component Value Date   [facility] <15 02/17/2024    Metabolic Disorder Labs: Lab Results  Component Value Date   HGBA1C 4.7 (L) 02/17/2024   MPG 88.19 02/17/2024   No results found for: PROLACTIN Lab Results  Component Value Date   CHOL 208 (H) 02/17/2024   TRIG 103 02/17/2024   HDL 73 02/17/2024   CHOLHDL 2.8 02/17/2024   VLDL 21 02/17/2024   LDLCALC 114 (H) 02/17/2024    Therapeutic Lab Levels: No results found for: LITHIUM No results found for: VALPROATE No results found for: CBMZ  Physical Findings   Flowsheet Row ED from 02/17/2024 in [facility] ED from 10[facility]06/2023 in [facility] Emergency Department at [facility] ED from 10/05/2023 in [facility] Emergency Department at [facility]  C-SSRS RISK CATEGORY No Risk No Risk No Risk     Musculoskeletal  Strength & Muscle Tone: within normal limits Gait & Station: normal Patient leans: N/A  Psychiatric Specialty Exam  Presentation  General Appearance:  Appropriate for Environment  Eye Contact: Good  Speech: Clear and Coherent  Speech Volume: Normal  Handedness: Right   Mood and Affect  Mood: Anxious; Depressed  Affect: Congruent; Tearful   Thought Process  Thought Processes: Coherent; Goal Directed  Descriptions of Associations:Intact  Orientation:Full (Time, Place and Person)  Thought Content:Logical  Diagnosis of Schizophrenia or Schizoaffective disorder in past: No    Hallucinations:Hallucinations: None  Ideas of Reference:None  Suicidal Thoughts:Suicidal Thoughts: No  Homicidal Thoughts:Homicidal Thoughts: No   Sensorium  Memory: Immediate Good; Remote Good  Judgment: Good  Insight: Fair  Art therapist   Concentration: Good  Attention Span: FairMERL Richard  Recall: Good  Fund of Knowledge: Good  Language: Good   Psychomotor Activity  Psychomotor Activity: Psychomotor Activity: Normal  Assets  Assets: Communication Skills; Desire for Improvement; Talents/Skills  Sleep  Sleep: Sleep: Poor  No Safety Checks orders active in given range  Nutritional Assessment (For OBS and [facility] admissions only) Has the patient had a weight loss or gain of 10 pounds or more in the last 3 months?: No Has the patient had a decrease in food intake/or appetite?: Yes Does the patient have dental problems?: No Does the patient have eating habits or behaviors that may be indicators of an eating disorder including binging or inducing vomiting?: Yes (Lost 240lbs between 2017 and 2023. Reports purging after binge) Has the patient recently lost weight without trying?: 0 Has the patient been eating poorly because of a decreased appetite?: 1 Malnutrition Screening Tool Score: 1    Physical Exam  Physical Exam Vitals and nursing note reviewed.  Constitutional:      Appearance: Normal appearance.  Pulmonary:     Effort: Pulmonary effort is normal.  Neurological:     General: No focal deficit present.     Mental Status: She is alert. Mental status is at baseline.  Psychiatric:        Mood and Affect: Mood normal.        Behavior: Behavior normal.        Thought Content: Thought content normal.        Judgment: Judgment normal.    Review of Systems  Musculoskeletal:  Positive for back pain.  Skin:  Positive for itching.  Neurological:  Positive for headaches.  Psychiatric/Behavioral:  Positive for depression. Negative for hallucinations and suicidal  ideas.    Blood pressure 127/64, pulse 62, temperature 98.4 F (36.9 C), temperature source Oral, resp. rate 18, last menstrual period 04/24/2015, SpO2 100%. There is no height or weight on file to calculate BMI.  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 She reported significant improvement on Zonegran  after trials of Vraylar and Abilify. Current symptomatology less concerning for mania or hypomania given time course of 2-3 days and relatively mild symptoms (yes sleep disturbance but no frank insomnia, yes increased goal-directed behavior and ambitious ideas but no frank grandiosity). Recommend brief inpatient hospitalization for re-initiation of mood stabilizer/medication management and monitoring for side effects given history of thyroid disease and obesity.***  # Hx GAD # Hx MDD - Continue Wellbutrin  150 mg PO every day  - Hold Buspar 10 mg  # Somatic symptoms 2/2 ?perimenopause Patient endorses mild headache, backache, and irritability, which she attributes to perimenopause. - Naproxen 250 mg PO Q8H PRN for mild pain and headache pended  Jayson LELON Ness, Medical Student 02/18/2024 8:25 AM

## 2024-02-18 NOTE — ED Notes (Signed)
 Pt offered lunch. PT refused. PT requested to take a shower before discharge. PT given hygiene kit to complete shower

## 2024-02-18 NOTE — ED Notes (Signed)
 PRN Tylenol  given due to patient reports of a headache rating pain 3 or 4 out of 10. Medication administered with no complications. Environment secured, safety checks in place per facility policy.

## 2024-02-18 NOTE — ED Notes (Signed)
 Patient resting in lounger with eyes closed, respirations even and unlabored. Patient in no apparent acute distress. Environment secured. Safety checks in place per facility protocol.

## 2024-02-18 NOTE — ED Notes (Signed)
 Patient alert & oriented x4. Denies intent to harm self or others when asked. Denies A/VH. Patient denies any physical complaints when asked. No acute distress noted. Support and encouragement provided. Scheduled medications administered with no complications. Patient observed in milieu. No inappropriate behaviors seen or reported. Routine safety checks conducted per facility protocol. Encouraged patient to notify staff if any thoughts of harm towards self or others arise. Patient verbalizes understanding and agreement.

## 2024-02-18 NOTE — ED Notes (Signed)
 Patient discharged home. AVS reviewed, no questions at this time. Patient belongings returned complete and intact. Staff escorted patient to lobby, safety maintained.

## 2024-02-18 NOTE — Discharge Instructions (Signed)
 [facility] [address], SECOND FLOOR [place], [place] [address] [phone] OUTPATIENT Walk-in information: Please note, all walk-ins are first come & first serve, with limited number of availability.  Please note that to be eligible for services you must bring: ID or a piece of mail with [facility] address  Therapist for therapy:  Monday & Wednesdays: Please ARRIVE at 7:15 AM for registration Will START at 8:00 AM Every 1st & 2nd Friday of the month: Please ARRIVE at 10:15 AM for registration Will START at 1 PM - 5 PM  Psychiatrist for medication management: Monday - Friday:  Please ARRIVE at 7:15 AM for registration Will START at 8:00 AM  Regretfully, due to limited availability, please be aware that you may not been seen on the same day as walk-in. Please consider making an appoint or try again. Thank you for your patience and understanding.    [facility] [address] [place], [place], [address] [phone] phone  [facility] [address] [place], [place], [address] [phone] phone [phone] phone ([address], Anthem/Elevance, [address], [place], [address], [address], Healthy [place], [place], [place], [address], [employer], [place], UHC, [employer], [place], Out of Network)  [employer], [place] [address] [place], [place], [address] [phone] phone ([place], Anthem/Elevance, San[employer]SCI Options/Carelon, BCBS, [address], [place], [place], [place], [place], [employer], [employer], [place], [place], [facility])  [employer] [address] [place], [place], [address] [phone] phone (Medicaid, ask about other insurance)  [facility] [address] [place], [place], [address] [phone] phone [phone] fax ([address], [place] , [place], [place], [place] Health Choice,  UHC, [employer], Self-Pay)  Sarah Lempka [address] [place], [place], [address] [phone] phone ([address] Anthem/Elevance, [address], [address], [place], [employer], [place], [place], [place], [employer], [place], [place], [place], [facility])  [facility] - 6-8 MONTH WAIT FOR THERAPY; [facility] FOR MEDICATION MANAGEMENT [address] [place], [place], [address] [phone] phone ([address] Amer[employer]Health [address] - , [address], [place], [place], Friday Health Plans, [address], BCBS Healthy [place], [place], [address], [place], [place], [place], [place], Tricare, UHC, Safeco Corporation, [place])  [facility] [address], [address] [place], [place], [address] [phone] phone  [facility] [address], [address] [place], [place], [address] [phone] phone  [facility] [address], [address] [place], [place], [address] [phone] phone  [facility] - THERAPY ONLY [address]  [place], [place], [address] [phone] phone  [facility], [place] [address], [place], [address] [phone] phone  [facility] [address] [place], [place], [address] [phone] phone [phone] fax  [facility] [address] [place], [place], [address] [phone] phone [phone] fax  [facility] [phone] [address] [place], [place], [address] [phone] phone  Janit Griffins [address] [place], [place], [address]  [phone] phone  [facility]  (Adults Only) [address] [place], [place], [address]  [phone] phone [phone] fax   Follow-up recommendations:  Activity:  Normal, as tolerated Diet:  Per PCP recommendation  Patient is instructed prior to discharge to: Take all medications as prescribed by her mental healthcare provider. Report any adverse  effects and/or reactions from the medicines to her outpatient provider promptly. Patient has been instructed & cautioned: To not engage in  alcohol and or illegal drug use while on prescription medicines.  In the event of worsening symptoms, patient is instructed to call the crisis hotline at 988, 911 and or go to the nearest ED for appropriate evaluation and treatment of symptoms. To follow-up with her primary care provider for your other medical issues, concerns and or health care needs.    Outpatient Services for Therapy and Medication Management for Medicaid  Based on what you have shared, a list of resources for outpatient therapy and psychiatry is provided below to get you started back on treatment.  It is imperative that you follow through with treatment within 5-7 days from the day of discharge to prevent any further risk to your safety or mental well-being.  You are not limited to the list provided.  In case of an urgent crisis, you may contact the [place] Crisis Unit with [facility] at [phone].

## 2024-02-18 NOTE — ED Notes (Signed)
 Patient observed/assessed setting up in bed/chair sitting quietly appearing in no distress and verbalizing no complaints at this time. Will continue to monitor.

## 2024-02-19 LAB — PROLACTIN: Prolactin: 9 ng/mL (ref 4.8–33.4)
